# Patient Record
Sex: Male | Born: 1935 | Race: White | Hispanic: No | Marital: Married | Smoking: Never smoker
Health system: Southern US, Community
[De-identification: ages and names within clinical notes are randomized; demographics above are authoritative.]

## PROBLEM LIST (undated history)

## (undated) DIAGNOSIS — E785 Hyperlipidemia, unspecified: Secondary | ICD-10-CM

## (undated) DIAGNOSIS — I10 Essential (primary) hypertension: Secondary | ICD-10-CM

## (undated) HISTORY — PX: JOINT REPLACEMENT: SHX530

## (undated) HISTORY — DX: Essential (primary) hypertension: I10

## (undated) HISTORY — DX: Hyperlipidemia, unspecified: E78.5

---

## 2005-02-13 ENCOUNTER — Inpatient Hospital Stay (HOSPITAL_COMMUNITY): Admission: EM | Admit: 2005-02-13 | Discharge: 2005-02-15 | Payer: Self-pay | Admitting: Emergency Medicine

## 2005-09-15 ENCOUNTER — Encounter: Admission: RE | Admit: 2005-09-15 | Discharge: 2005-09-15 | Payer: Self-pay | Admitting: Vascular Surgery

## 2007-02-06 ENCOUNTER — Encounter: Admission: RE | Admit: 2007-02-06 | Discharge: 2007-02-06 | Payer: Self-pay | Admitting: Orthopedic Surgery

## 2009-03-31 ENCOUNTER — Ambulatory Visit: Payer: Self-pay | Admitting: Gastroenterology

## 2009-04-04 ENCOUNTER — Encounter: Admission: RE | Admit: 2009-04-04 | Discharge: 2009-04-04 | Payer: Self-pay | Admitting: Orthopedic Surgery

## 2010-05-03 ENCOUNTER — Telehealth: Payer: Self-pay | Admitting: Gastroenterology

## 2010-06-09 ENCOUNTER — Encounter (INDEPENDENT_AMBULATORY_CARE_PROVIDER_SITE_OTHER): Payer: Self-pay | Admitting: *Deleted

## 2010-06-11 ENCOUNTER — Ambulatory Visit: Payer: Self-pay | Admitting: Internal Medicine

## 2010-06-30 ENCOUNTER — Ambulatory Visit: Payer: Self-pay | Admitting: Internal Medicine

## 2010-07-01 ENCOUNTER — Encounter: Payer: Self-pay | Admitting: Internal Medicine

## 2011-01-13 NOTE — Miscellaneous (Signed)
Summary: [facility] PV  Clinical Lists Changes  Medications: Added new medication of DULCOLAX 5 MG  TBEC (BISACODYL) Day before procedure take 2 at 3pm and 2 at 8pm. - Signed Added new medication of REGLAN 10 MG  TABS (METOCLOPRAMIDE HCL) As per prep instructions. - Signed Added new medication of MIRALAX   POWD (POLYETHYLENE GLYCOL 3350) As per prep  instructions. - Signed Rx of DULCOLAX 5 MG  TBEC (BISACODYL) Day before procedure take 2 at 3pm and 2 at 8pm.;  #4 x 0;  Signed;  Entered by: Ezra Sites RN;  Authorized by: Hart Carwin MD;  Method used: Electronically to [facility]  [phone]*, [address] [place], [place], [place]  [address], Ph: [phone] or [phone], Fax: [phone] Rx of REGLAN 10 MG  TABS (METOCLOPRAMIDE HCL) As per prep instructions.;  #2 x 0;  Signed;  Entered by: Ezra Sites RN;  Authorized by: Hart Carwin MD;  Method used: Electronically to [facility]  [phone]*, [address], [place], [place], [place]  [address], Ph: [phone] or [phone], Fax: [phone] Rx of MIRALAX   POWD (POLYETHYLENE GLYCOL 3350) As per prep  instructions.;  #255gm x 0;  Signed;  Entered by: Ezra Sites RN;  Authorized by: Hart Carwin MD;  Method used: Electronically to [facility]  [phone]*, [address] [place], [place], [place]  [address], Ph: [phone] or [phone], Fax: [phone] Observations: Added new observation of ALLERGY REV: Done (06/11/2010 10:49)    Prescriptions: MIRALAX   POWD (POLYETHYLENE GLYCOL 3350) As per prep  instructions.  #255gm x 0   Entered by:   Ezra Sites RN   Authorized by:   Hart Carwin MD   Signed by:   Ezra Sites RN on 06/11/2010   Method used:   Electronically to        [facility]  [phone]* (retail)       [address]       [place], [place]  [address]       Ph: [phone] or [phone]       Fax: [phone]   RxID:    [id number] REGLAN 10 MG  TABS (METOCLOPRAMIDE HCL) As per prep instructions.  #2 x 0   Entered by:   Ezra Sites RN   Authorized by:   Hart Carwin MD   Signed by:   Ezra Sites RN on 06/11/2010   Method used:   Electronically to        [facility]  [phone]* (retail)       [address]       [place], [place]  [address]       Ph: [phone] or [phone]       Fax: [phone]   RxID:   [id number] DULCOLAX 5 MG  TBEC (BISACODYL) Day before procedure take 2 at 3pm and 2 at 8pm.  #4 x 0   Entered by:   Ezra Sites RN   Authorized by:   Hart Carwin MD   Signed by:   Ezra Sites RN on 06/11/2010   Method used:   Electronically to        [facility]  [phone]* (retail)       [address]       [place], [place]  [address]  Ph: [phone] or [phone]       Fax: [phone]   RxID:   [id number]

## 2011-01-13 NOTE — Progress Notes (Signed)
Summary: Provider Switch  Phone Note From Other Clinic   Caller: Asher Muir @ [facility]  [phone] Call For: Dr. Arlyce Dice Summary of Call: Pt. is a pt. of Dr. Arlyce Dice and would like to switch to Dr. Juanda Chance b/c she was recommended from friends and family. Initial call taken by: Karna Christmas,  May 03, 2010 9:42 AM  Follow-up for Phone Call        [place] ntfd. of process for transfer to DrBrodie and that request will be forwarded to Dr.Kaplan Follow-up by: Teryl Lucy RN,  May 03, 2010 10:30 AM  Additional Follow-up for Phone Call Additional follow up Details #1::        ok Additional Follow-up by: Louis Meckel MD,  May 04, 2010 12:00 PM     Appended Document: Provider Switch OK DB  Appended Document: Provider Switch Message left for Asher Muir at [facility], MD switch request has been approved, please call [phone] to schedule an appt. with Dr.Brodie.

## 2011-01-13 NOTE — Letter (Signed)
Summary: Patient Notice- Polyp Results  [facility]  [address] [place], [place] [address]   Phone: [phone]  Fax: [phone]        July 01, 2010 MRN: [id number]    Jimmy Orr [address] [place], [place]  [address]    Dear Mr. Ahrendt,  I am pleased to inform you that the colon polyp(s) removed during your recent colonoscopy was (were) found to be benign (no cancer detected) upon pathologic examination.thee out of five polyps were adenomatous ( precancerous).  I recommend you have a repeat colonoscopy examination in 3_ years to look for recurrent polyps, as having colon polyps increases your risk for having recurrent polyps or even colon cancer in the future.  Should you develop new or worsening symptoms of abdominal pain, bowel habit changes or bleeding from the rectum or bowels, please schedule an evaluation with either your primary care physician or with me.  Additional information/recommendations:  _x_ No further action with gastroenterology is needed at this time. Please      follow-up with your primary care physician for your other healthcare      needs.  __ Please call [phone] to schedule a return visit to review your      situation.  __ Please keep your follow-up visit as already scheduled.  __ Continue treatment plan as outlined the day of your exam.  Please call us if you are having persistent problems or have questions about your condition that have not been fully answered at this time.  Sincerely,  Hart Carwin MD  This letter has been electronically signed by your physician.  Appended Document: Patient Notice- Polyp Results letter mailed

## 2011-01-13 NOTE — Letter (Signed)
Summary: Miralax Instructions  [facility]  [address]   [place], [place] [address]   Phone: [phone]  Fax: [phone]       Jimmy Orr    August 28, 1931    MRN: [id number]       Procedure Day Dorna Bloom: Wednesday, 06-30-10     Arrival Time: 9:30 a.m.     Procedure Time: 10:30 a.m.     Location of Procedure:                    x   [facility] (4th Floor)    PREPARATION FOR COLONOSCOPY WITH MIRALAX  Starting 5 days prior to your procedure  06-25-10 do not eat nuts, seeds, popcorn, corn, beans, peas,  salads, or any raw vegetables.  Do not take any fiber supplements (e.g. Metamucil, Citrucel, and Benefiber). ____________________________________________________________________________________________________   THE DAY BEFORE YOUR PROCEDURE         DATE: 06-29-10  DAY: Tuesday  1   Drink clear liquids the entire day-NO SOLID FOOD  2   Do not drink anything colored red or purple.  Avoid juices with pulp.  No orange juice.  3   Drink at least 64 oz. (8 glasses) of fluid/clear liquids during the day to prevent dehydration and help the prep work efficiently.  CLEAR LIQUIDS INCLUDE: Water Jello Ice Popsicles Tea (sugar ok, no milk/cream) Powdered fruit flavored drinks Coffee (sugar ok, no milk/cream) Gatorade Juice: apple, white grape, white cranberry  Lemonade Clear bullion, consomm, broth Carbonated beverages (any kind) Strained chicken noodle soup Hard Candy  4   Mix the entire bottle of Miralax with 64 oz. of Gatorade/Powerade in the morning and put in the refrigerator to chill.  5   At 3:00 pm take 2 Dulcolax/Bisacodyl tablets.  6   At 4:30 pm take one Reglan/Metoclopramide tablet.  7  Starting at 5:00 pm drink one 8 oz glass of the Miralax mixture every 15-20 minutes until you have finished drinking the entire 64 oz.  You should finish drinking prep around 7:30 or 8:00 pm.  8   If you are nauseated, you may take the 2nd  Reglan/Metoclopramide tablet at 6:30 pm.        9    At 8:00 pm take 2 more DULCOLAX/Bisacodyl tablets.     THE DAY OF YOUR PROCEDURE      DATE:   06-30-10  DAY: Wednesday  You may drink clear liquids until 8:30 a.m.  (2 HOURS BEFORE PROCEDURE).   MEDICATION INSTRUCTIONS  Unless otherwise instructed, you should take regular prescription medications with a small sip of water as early as possible the morning of your procedure.         OTHER INSTRUCTIONS  You will need a responsible adult at least 75 years of age to accompany you and drive you home.   This person must remain in the waiting room during your procedure.  Wear loose fitting clothing that is easily removed.  Leave jewelry and other valuables at home.  However, you may wish to bring a book to read or an iPod/MP3 player to listen to music as you wait for your procedure to start.  Remove all body piercing jewelry and leave at home.  Total time from sign-in until discharge is approximately 2-3 hours.  You should go home directly after your procedure and rest.  You can resume normal activities the day after your procedure.  The day of your procedure you should not:  Drive   Make legal decisions   Operate machinery   Drink alcohol   Return to work  You will receive specific instructions about eating, activities and medications before you leave.   The above instructions have been reviewed and explained to me by   Ezra Sites RN  June 11, 2010 11:13 AM     I fully understand and can verbalize these instructions _____________________________ Date _______

## 2011-01-13 NOTE — Procedures (Signed)
Summary: Colonoscopy  Patient: Jimmy Orr Note: All result statuses are Final unless otherwise noted.  Tests: (1) Colonoscopy (COL)   COL Colonoscopy           DONE     [facility]     [address]     [place], [place]  [address]           COLONOSCOPY PROCEDURE REPORT           PATIENT:  Jimmy, Orr  MR#:  [id number]     BIRTHDATE:  01/05/1931, 78 yrs. old  GENDER:  male     ENDOSCOPIST:  Hedwig Morton. Juanda Chance, MD     REF. BY:  Benedetto Goad, MD     PROCEDURE DATE:  06/30/2010     PROCEDURE:  Colonoscopy 213[address]     ASA CLASS:  Class I     INDICATIONS:  Routine Risk Screening flexible sigmoidoscopy in     1989 and in 1999     MEDICATIONS:   Versed 8 mg, Fentanyl 75 mcg           DESCRIPTION OF PROCEDURE:   After the risks benefits and     alternatives of the procedure were thoroughly explained, informed     consent was obtained.  Digital rectal exam was performed and     revealed no rectal masses.   The LB [id number]-[id number] endoscope     was introduced through the anus and advanced to the cecum, which     was identified by both the appendix and ileocecal valve, without     limitations.  The quality of the prep was good, using MiraLax.     The instrument was then slowly withdrawn as the colon was fully     examined.     <<PROCEDUREIMAGES>>           FINDINGS:  There were multiple polyps identified and removed.     throughout the colon. The polyp was removed using cold biopsy     forceps. Polyps were snared without cautery. Retrieval was     successful (see image2, image3, image4, image9, image10, and     image11). snare polyp 5 polyps, 3 in the right colon and 2 in left     colon, size 4-8 mm, all sessile  This was otherwise a normal     examination of the colon (see image12, image7, image6, and     image5).   Retroflexed views in the rectum revealed no     abnormalities.    The scope was then withdrawn from the patient     and the procedure completed.        COMPLICATIONS:  None     ENDOSCOPIC IMPRESSION:     1) Polyps, multiple throughout the colon     2) Otherwise normal examination     RECOMMENDATIONS:     1) Await pathology results     2) High fiber diet.     REPEAT EXAM:  In 3 - 5 year(s) for.           ______________________________     Hedwig Morton. Juanda Chance, MD           CC:           n.     eSIGNED:   Hedwig Morton. Koron Godeaux at 06/30/2010 12:18 PM           Doran Durand, [id number]  Note: An exclamation mark (!) indicates a result that was  not dispersed into the flowsheet. Document Creation Date: 06/30/2010 12:20 PM _______________________________________________________________________  (1) Order result status: Final Collection or observation date-time: 06/30/2010 12:10 Requested date-time:  Receipt date-time:  Reported date-time:  Referring Physician:   Ordering Physician: Lina Sar [phone]) Specimen Source:  Source: Launa Grill Order Number: [id number] Lab site:   Appended Document: Colonoscopy     Procedures Next Due Date:    Colonoscopy: 10/2010

## 2011-04-29 NOTE — Consult Note (Signed)
NAMEWYNSTON, Orr NO.:  [id number]   MEDICAL RECORD NO.:  [id number]          PATIENT TYPE:  INP   LOCATION:  3728                         FACILITY:  [facility]   PHYSICIAN:  Larina Earthly, M.D.    DATE OF BIRTH:  10/30/1931   DATE OF CONSULTATION:  02/15/2005  DATE OF DISCHARGE:                                   CONSULTATION   HISTORY OF PRESENT ILLNESS:  Jimmy Orr is a 77 year old gentleman who was  admitted on February 13, 2005 with severe substernal chest pain. He reports that  he also had left flank pain. He had had  prior similar substernal pain that  has been worked up before and diagnosed as reflux. He had never had any  prior left flank pain and that resolved. He has had a negative cardiac  workup. At the time of admission, he had a CT scan to rule out thoracic  dissection. This did show no evidence of thoracic dissection. There was a  question of a celiac access irregularity and therefore, he underwent a  dedicated CT angio of his abdominal aorta. I am seeing him for further  evaluation of this.   PAST MEDICAL HISTORY:  Significant for hypertension. Does have a history of  gallstones and hyperlipidemia. He has no history of cardiac disease.   SOCIAL HISTORY:  He does not smoke or drink alcohol.   FAMILY HISTORY:  He has a family history of father dying during coronary  artery bypass grafting. Mother has Alzheimer's.   PHYSICAL EXAMINATION:  GENERAL:  A well developed, well nourished white male  appearing stated age of 77. In no acute distress.  VITAL SIGNS:  Blood pressure is 101/71, pulse 90, respiratory rate 16, he is  afebrile.  EXTREMITIES:  His radial, femoral and dorsalis pedis pulses are 2+  bilaterally.  ABDOMEN:  Examination shows no evidence of aneurysm. He has no abdominal  tenderness to palpation.   LABORATORY DATA:  His CT angio does show a linear irregularity in the celiac  access. There is no flow limitation through the branches of  the celiac  artery. The superior mesenteric artery and inferior mesenteric artery are  widely patent without any evidence of flow limiting difficulties.   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 I discussed this at length with Mr. and  Jimmy Orr. Will see him in 6 months for repeat CT angio to rule out any  change.      TFE/MEDQ  D:  02/15/2005  T:  02/15/2005  Job:  [id number]

## 2011-04-29 NOTE — Discharge Summary (Signed)
Jimmy Orr, GIPE NO.:  [id number]   MEDICAL RECORD NO.:  [id number]          PATIENT TYPE:  INP   LOCATION:  3728                         FACILITY:  [facility]   PHYSICIAN:  Elliot Cousin, M.D.    DATE OF BIRTH:  1931/05/18   DATE OF ADMISSION:  02/13/2005  DATE OF DISCHARGE:  02/15/2005                                 DISCHARGE SUMMARY   DISCHARGE DIAGNOSES:  1.  Chest pain, myocardial infarction ruled out.  2.  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  3.  Hyperbilirubinemia thought to be secondary to gallstones.  4.  Cholelithiasis without evidence of cholecystitis per ultrasound and CT      scan of the abdomen, March5,2006   SECONDARY DISCHARGE DIAGNOSES:  1.  Hypertension.  2.  Hyperlipidemia (the patient refuses to take Zetia as prescribed).  3.  Hiatal hernia.  4.  Gastroesophageal reflux disease.   DISCHARGE MEDICATIONS:  1.  Lopressor 50 mg daily.  2.  Protonix 40 mg daily.  3.  Mylanta one to two tablespoons every 6 hours as needed.  4.  Percocet 5 mg every 6 hours as needed for pain.  5.  Zetia 10 mg daily (the patient was advised to discuss this medication      with his primary care physician).   DISCHARGE DISPOSITION:  The patient was discharged to home on March7,2006 in  improved and stable condition.  The patient was advised to follow up with  Dr. Andrey Campanile in 1 week.  Dr. Arbie Cookey will schedule a followup appointment with  the patient.   HISTORY OF PRESENT ILLNESS:  The patient is a 77 year old man with a past  medical history significant for hypertension, gastroesophageal reflux  disease and gallstones, who presented to the emergency department on  March5,2006 with a chief complaint of retrosternal chest pain.  He rated the  pain as a 10/10 in intensity.  There was no associated diaphoresis,  shortness of breath, palpitations, nausea or vomiting.  When the patient was  evaluated in the emergency department, a CT scan of the chest revealed no  pulmonary embolism and no evidence of aortic dissection.  The initial  cardiac markers were negative.  However, the patient was admitted for  further evaluation and management.   HOSPITAL COURSE:  PROBLEM #1 - CHEST PAIN:  The patient's EKG on admission  revealed normal sinus rhythm with a heart rate of 91 beats per minute and  question of Q-waves in the inferior leads.  There were no significant ST or  T-wave abnormalities.  The patient was started on Lopressor 50 mg daily and  prophylactic Lovenox.  Nitroglycerin sublingual was prescribed as needed.  In addition, the patient was started on Nitrol paste half an inch every 4  hours.  Cardiac enzymes were ordered.  The cardiac enzymes were completely  negative x2 sets.  As mentioned above his cardiac markers x3 were negative  while he was evaluated in the emergency department.  The patient's fasting  lipid panel was assessed  during hospital course and revealed a total  cholesterol of 177, triglycerides of 85, HDL cholesterol of 46, and an LDL  cholesterol of 114.  The patient had been prescribed Zetia by his primary  care physician; however, the patient stopped secondary to nonspecific side-  effects.  The patient was started on Protonix and Mylanta for possible  reflux symptoms.  The Protonix and Mylanta did seem to lessen his chest  pain.  The patient was chest-pain-free at the time of hospital discharge.   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  The CT scan was negative for PE.  However, upon  further evaluation by the radiologist, there was an abnormality seen near  the origin of the celiac artery.  Therefore, a CT angiogram of the abdomen  was ordered for further clarification.  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  The SMA and IMA were widely patent on the CT angiogram of the abdomen.  Following this finding, vascular surgeon, Dr. Arbie Cookey, was consulted.  Per Dr.  Bosie Helper assessment, the patient either had a dissection or an eccentric  plaque in the celiac access.  He felt that this was probably chronic.  He  discussed the results of the CT angiogram at length with the patient and the  patient's wife.  He felt that the patient had no immediate risk regarding  this finding.  He recommended repeating a CT angiogram in 6 months to rule  out any change.  Dr. Arbie Cookey stated that he will follow up with the patient  then.   PROBLEM #3 - CHRONIC CHOLELITHIASIS:  Gallstones were seen on the CT scan of  the chest and abdomen.  There was no evidence of cholecystitis.  However,  for further clarification, an ultrasound of the abdomen was ordered.  The  ultrasound revealed several mobile gallstones and no evidence of biliary  obstruction.  The patient had only mild right upper quadrant tenderness.  His bilirubin was mildly elevated, ranging between 1.5 and 1.6, during  hospitalization.  However, his liver transaminases were well within normal  limits.  It was felt that the patient was non-acute with regards to the  gallstones and therefore the patient will be monitored for now.      DF/MEDQ  D:  04/29/2005  T:  04/29/2005  Job:  [id number]   cc:   Gloriajean Dell. Andrey Campanile, M.D.  [address]  [place]  [place] [address]  Fax: [phone]   Larina Earthly, M.D.  [address]  [place]  [place] [address]

## 2011-04-29 NOTE — H&P (Signed)
NAME:  Jimmy Orr, Jimmy Orr                ACCOUNT NO.:  [id number]   MEDICAL RECORD NO.:  [id number]          PATIENT TYPE:  EMS   LOCATION:  [facility]                         FACILITY:  [facility]   PHYSICIAN:  Mobolaji B. Bakare, M.D.DATE OF BIRTH:  [birth year]   DATE OF ADMISSION:  02/13/2005  DATE OF DISCHARGE:                                HISTORY & PHYSICAL   PRIMARY CARE PHYSICIAN:  Dr. Benedetto Goad.   CHIEF COMPLAINT:  Retrosternal chest pain.   HISTORY OF PRESENTING COMPLAINT:  [name] is a pleasant 77 year old  Caucasian male with a history of hypertension, gastroesophageal reflux  disease and gallstones.  He started having retrosternal chest pain about 5  a.m. today.  He was already awake at that time and the pain was radiating to  his left shoulder blade.  It was 10/10.  No accompanying diaphoresis,  shortness of breath, or palpitations,  nausea or vomiting.  It is pleuritic  in nature, worsens with deep inspiration.  He feels very uncomfortable lying  down supine hence, he has been sitting up in bed.  There is no accompanying  fever or chills or rigors.   He has had similar pain in the past.  Most recently one was five years ago  and at that time, he had a stress test by [facility] which was  negative.  He also underwent an upper GI series which was negative and a  colonoscopy.   This pain is not related to meals.  He had negative cardiac enzymes at the  point of care.  His EKG was normal sinus rhythm, no evidence of  pericarditis.  He had a CT scan of the chest, CT scan angiogram was negative  for pulmonary embolism.  No aortic dissection.  There were gallstones but  without evidence of cholecystitis.  No pericholecystic fluid.  It did show  periaortic thickening and subsegmental atelectasis in the mid and lower  lungs bilaterally.   He was given morphine in the emergency room and this improved the pain  somewhat down to 4/10 however, he had some nausea and vomiting  with this.   REVIEW OF SYSTEMS:  He denies cough, dyspnea on exertion, shortness of  breath, no constipation, no diarrhea.  No dysuria.   PAST MEDICAL HISTORY:  1.  Hypertension.  2.  Hyperlipidemia.  3.  Hiatus hernia.  4.  Gallstones.   MEDICATIONS:  1.  Zetia 10 mg p.o. daily.  He stopped this a while ago secondary to side      effects.  2.  Lopressor 50 mg p.o. daily.  3.  Vitamins.   ALLERGIES:  Side effects to statins with cramps and muscle aches.   FAMILY HISTORY:  Father died during bypass surgery and mom is still alive  with Alzheimer's and she has a history of angina.  The patient is married  and lives with his wife.  He has got a supportive son who lives in the area.   SOCIAL HISTORY:  He never smoked cigarettes and does not drink alcohol.  He  is a retired Personnel officer.  PHYSICAL EXAMINATION:  VITAL SIGNS:  On arrival in the emergency department,  temperature 98.1, blood pressure 172/97, currently blood pressure is 189/86,  repeated heart rate 109, respiratory rate 22 and O2 saturations of 95% on 2  liters.  GENERAL:  On examination, the patient is fairly obese, not in respiratory  distress.  Sitting up in bed. He looks somewhat apprehensive.  HEENT:  Not pale.  Anicteric.  Normocephalic, atraumatic head. Pupils equal,  round, reactive to light.  Extraocular muscles movements intact.  NECK:  No thyromegaly.  Mucous membranes moist.  No oropharyngeal exudate.  LUNGS:  Clear clinically to auscultation.  CARDIOVASCULAR:  S1, S2.  Regular, no murmur.  No gallop.  CHEST WALL:  No tenderness.  ABDOMEN:  Obese, soft.  Mild tenderness on deep palpation over the  epigastrium.  No hepatosplenomegaly.  No holosystolic murmur.  Bowel sounds  present.  EXTREMITIES:  No calf tenderness.  No pedal edema.  No cyanosis.  Dorsalis  pedis pulses palpable bilaterally at 2+.  SKIN:  No rash, no petechia.  CNS:  Muscle power 5/5 in all limbs.  Cranial nerves intact.   INITIAL  LABORATORY DATA:  Cardiac markers at the point of care were  essentially within normal x3.  Sodium 139, potassium 4.1, chloride 108, BUN  18, glucose 105, bicarb 26, hemoglobin 15.6, hematocrit 46, total protein  6.7, albumin 3.6, AST 25, ALT 22, alkaline phosphatase 67, bilirubin 1.5,  total bilirubin 1.5, indirect bilirubin 1.3, lactic acid 1.4, lipase 16,  amylase 65, creatinine 1.2, BUN 18.   EKG:  Normal sinus rhythm.  Chest x-ray showed cardiac enlargement, low  volume chest film with vascular crowding and bibasilar atelectasis.  No  edema or infiltrate.   ASSESSMENT AND PLAN:  [name] is a 77 year old Caucasian male with  a history of hypertension, hiatus hernia and gallstones presenting with  retrosternal chest pain.  He has had negative cardiac enzymes at the point  of care.  EKG is normal sinus rhythm.  CT angiogram is negative for  pulmonary embolism, dissection, and no pericardial effusion.   PROBLEMS:  1.  Chest pain, probable gastrointestinal related however, will admit to      telemetry, rule out myocardial infarction with serial cardiac enzymes.      Obtain ultrasound of the gallbladder in the morning, obtain 2D      echocardiogram and in view of the cardiomegaly.   Protonix 40 mg p.o. daily.  Nitro paste 0.5 inch topical q.4h., baby aspirin  81 mg daily, hepatitic profile in the a.m., Mylanta p.o. 15 cc p.r.n.,  Dilaudid 0.5 mg IV q.3h. p.r.n. for pain and Phenergan 12.5 mg IV q.4h.  p.r.n. for nausea and vomiting.   1.  Hypertension.  Uncontrolled.  This is probably complicated by the      current pain.  Will give IV Lopressor 5 mg and continue on Lopressor 50      mg p.o. daily.  2.  Sinus tachycardia secondary to pain.  Will control pain with Dilaudid as      above.  3.  Hyperlipidemia.  Check fasting lipid profile.  4.  Deep venous thrombosis prophylaxis.  Will use Lovenox 40 mg subcutaneous      daily.     MBB/MEDQ  D:  02/13/2005  T:  02/13/2005   Job:  [id number]   cc:   Gloriajean Dell. Andrey Campanile, M.D.  [address]  [place]  [place] [address]  Fax: [phone]

## 2012-04-13 ENCOUNTER — Encounter: Payer: Medicare Other | Attending: Family Medicine | Admitting: *Deleted

## 2012-04-13 DIAGNOSIS — Z713 Dietary counseling and surveillance: Secondary | ICD-10-CM | POA: Insufficient documentation

## 2012-04-13 DIAGNOSIS — E119 Type 2 diabetes mellitus without complications: Secondary | ICD-10-CM | POA: Insufficient documentation

## 2012-04-13 NOTE — Progress Notes (Signed)
  Medical Nutrition Therapy:  Appt start time: 1030 end time:  1130.  Assessment:  Primary concerns today: newly diagnosed with diabetes. Here for training on new BG Meter prior to attending Core Diabetes Classes in 2 weeks  MEDICATIONS: see list   DIETARY INTAKE:  Usual eating pattern includes 3 meals and 0 snacks per day.  Everyday foods include good variety of most food groups.  Avoided foods include carb containing foods until he can be taught carb counting.    24-hr recall:  B ( 9 AM): cereal - raisin bran, fresh fruit and 2 pcs toast with small amount of jam, OR eggs, bacon = lean, 2 toast or biscuits, decaf coffee with 1 tsp low fat creamer  Snk ( AM): none  L ( PM): light meal - sandwich and milk or water Snk ( PM): none D ( PM): eat out often at [employer]- lean meat, salad and water, occasionally pie Snk ( PM): none Beverages: decaf coffee, water, milk  Usual physical activity: walks on trail by house 2-3 times a week, 1-2 miles each time with hills  Estimated energy needs: to be determined in class  Progress Towards Goal(s):  In progress.   Nutritional Diagnosis:  NB-1.4 Self-monitoring deficit As related to use of SMBG meter.  As evidenced by being taught today.    Intervention:  Provided  One Touch Ultra Mini Self Monitoring Kit and instructed on it's use. Suggested he test once a day alternating the times of day until he can attend the Core Diabetes Classes. Due to his new diagnosis, he had several questions that I addressed in addition to the meter instruction.  Handouts given during visit include:  One Touch Ultra Mini Self Monitoring Kit with instructions  Lot # [id number] x  Expiration Date: 04/2012  Monitoring/Evaluation:  Dietary intake, exercise, SMBG, and body weight in 2 week(s) for Core Classes.

## 2012-04-14 ENCOUNTER — Encounter: Payer: Self-pay | Admitting: *Deleted

## 2012-04-14 NOTE — Patient Instructions (Signed)
Plan: Start checking your BG with meter you received today once a day at alternate times of day as directed by MD Plan to attend Core Diabetes Classes in 2 weeks as scheduled

## 2012-04-25 ENCOUNTER — Encounter: Payer: Medicare Other | Admitting: *Deleted

## 2012-04-26 ENCOUNTER — Encounter: Payer: Self-pay | Admitting: *Deleted

## 2012-04-26 NOTE — Patient Instructions (Signed)
Patient will attend Core Diabetes Courses II and III as scheduled or follow up prn.  

## 2012-04-26 NOTE — Progress Notes (Signed)
  Patient was seen on 04/26/12 for the first of a series of three diabetes self-management courses at the [facility]. The following learning objectives were met by the patient during this course:   Defines the role of glucose and insulin  Identifies type of diabetes and pathophysiology  Defines the diagnostic criteria for diabetes and prediabetes  States the risk factors for Type 2 Diabetes  States the symptoms of Type 2 Diabetes  Defines Type 2 Diabetes treatment goals  Defines Type 2 Diabetes treatment options  States the rationale for glucose monitoring  Identifies A1C, glucose targets, and testing times  Identifies proper sharps disposal  Defines the purpose of a diabetes food plan  Identifies carbohydrate food groups  Defines effects of carbohydrate foods on glucose levels  Identifies carbohydrate choices/grams/food labels  States benefits of physical activity and effect on glucose  Review of suggested activity guidelines  Last A1c (per MD referral on 04/10/12): 7.6%   Handouts given during class include:  Type 2 Diabetes: Basics Book  My Food Plan Book  Food and Activity Log  Patient has established the following initial goals:  Increase exercise levels.  Follow a diabetes meal plan.  Monitor blood glucose.  Keep doctor's appointments.  Get medical labs/tests done.  Follow-Up Plan: Patient will attend Core Diabetes Courses II and III as scheduled or follow up prn.

## 2012-05-24 ENCOUNTER — Encounter: Payer: Self-pay | Admitting: *Deleted

## 2012-05-24 ENCOUNTER — Encounter: Payer: Medicare Other | Attending: Family Medicine | Admitting: *Deleted

## 2012-05-24 DIAGNOSIS — Z713 Dietary counseling and surveillance: Secondary | ICD-10-CM | POA: Insufficient documentation

## 2012-05-24 DIAGNOSIS — E119 Type 2 diabetes mellitus without complications: Secondary | ICD-10-CM | POA: Insufficient documentation

## 2012-05-24 NOTE — Patient Instructions (Signed)
Goals:  Follow Diabetes Meal Plan as instructed  Eat 3 meals and 2 snacks, every 3-5 hrs  Limit carbohydrate intake to 45-60 grams carbohydrate/meal  Limit carbohydrate intake to 15 grams carbohydrate/snack  Add lean protein foods to meals/snacks  Monitor glucose levels as instructed by your doctor  Aim for 30 mins of physical activity daily  Bring food record and glucose log to your next nutrition visit 

## 2012-05-24 NOTE — Progress Notes (Signed)
  Patient was seen on 05/24/12 for the second of a series of three diabetes self-management courses at the [facility]. The following learning objectives were met by the patient during this course:   Explain basic nutrition maintenance and quality assurance  Describe causes, symptoms and treatment of hypoglycemia and hyperglycemia  Explain how to manage diabetes during illness  Describe the importance of good nutrition for health and healthy eating strategies  List strategies to follow meal plan when dining out  Describe the effects of alcohol on glucose and how to use it safely  Describe problem solving skills for day-to-day glucose challenges  Describe strategies to use when treatment plan needs to change  Identify important factors involved in successful weight loss  Describe ways to remain physically active  Describe the impact of regular activity on insulin resistance    Handouts given in class:    [facility] Oral medication/insulin handout  Follow-Up Plan: Patient will attend the final class of the ADA Diabetes Self-Care Education.   

## 2012-05-31 ENCOUNTER — Encounter: Payer: Medicare Other | Admitting: *Deleted

## 2012-05-31 ENCOUNTER — Encounter: Payer: Self-pay | Admitting: *Deleted

## 2012-05-31 NOTE — Patient Instructions (Signed)
Goals:  Follow Diabetes Meal Plan as instructed  Eat 3 meals and 2 snacks, every 3-5 hrs  Limit carbohydrate intake to 45-60 grams carbohydrate/meal  Limit carbohydrate intake to 15-30 grams carbohydrate/snack  Add lean protein foods to meals/snacks  Monitor glucose levels as instructed by your doctor  Aim for 30 mins of physical activity daily  Bring food record and glucose log to your next nutrition visit 

## 2012-05-31 NOTE — Progress Notes (Signed)
  Patient was seen on 05/31/12 for the third of a series of three diabetes self-management courses at the [facility]. The following learning objectives were met by the patient during this course:    Describe how diabetes changes over time   Identify diabetes complications and ways to prevent them   Describe strategies that can promote heart health including lowering blood pressure and cholesterol   Describe strategies to lower dietary fat and sodium in the diet   Identify physical activities that benefit cardiovascular health   Evaluate success in meeting personal goal   Describe the belief that they can live successfully with diabetes day to day   Establish 2-3 goals that they will plan to diligently work on until they return for the free 33-month follow-up visit  The following handouts were given in class:  3 Month Follow Up Visit handout  Goal setting handout  Class evaluation form  Your patient has established the following 3 month goals for diabetes self-care:  Count carbohydrates at most meals and snacks  Test glucose 1 times every day  Be active by walking trail behind home  Follow-Up Plan: Patient will attend a 3 month follow-up visit for diabetes self-management education.

## 2012-08-30 ENCOUNTER — Encounter: Payer: Medicare Other | Attending: Family Medicine | Admitting: *Deleted

## 2012-08-30 DIAGNOSIS — E119 Type 2 diabetes mellitus without complications: Secondary | ICD-10-CM | POA: Insufficient documentation

## 2012-08-30 DIAGNOSIS — Z713 Dietary counseling and surveillance: Secondary | ICD-10-CM | POA: Insufficient documentation

## 2012-08-30 NOTE — Progress Notes (Signed)
  Patient was seen on 08/30/12 for their 3 month follow-up as a part of the diabetes self-management courses at the [facility]. The following learning objectives were met by your patient during this course:  Patient self reports the following:  Diabetes control has improved since diabetes self-management training: yes! New HgA1C is 6.4% down from 7.6%!  Annette Stable has lost about 27 pounds since June, his kidney function has improved, and his lipid profile has improved Number of days blood glucose is >200: none Last MD appointment for diabetes: 07/18/12 Changes in treatment plan: no DM medications, well controlled Confidence with ability to manage diabetes: feels confident Areas for improvement with diabetes self-care: Needs low carb snack ideas- gave handout on low carb snacks Willingness to participate in diabetes support group: will try   Follow-Up Plan: Patient is eligible for a "free" 30 minute diabetes self-care appointment in the next year. Patient to call and schedule as needed.  Patient was seen on 05/31/12 for the third of a series of three diabetes self-management courses at the [facility]. The following learning objectives were met by the patient during this course:    Describe how diabetes changes over time   Identify diabetes complications and ways to prevent them   Describe strategies that can promote heart health including lowering blood pressure and cholesterol   Describe strategies to lower dietary fat and sodium in the diet   Identify physical activities that benefit cardiovascular health   Evaluate success in meeting personal goal   Describe the belief that they can live successfully with diabetes day to day   Establish 2-3 goals that they will plan to diligently work on until they return for the free 80-month follow-up visit  The following handouts were given in class:  3 Month Follow Up Visit handout  Goal setting  handout  Class evaluation form  Your patient has established the following 3 month goals for diabetes self-care:  Count carbohydrates at most meals and snacks  Test glucose 1 times every day  Be active by walking trail behind home  Follow-Up Plan: Patient will attend a 3 month follow-up visit for diabetes self-management education.  Diabetes support group meets every second Monday at 6 pm

## 2012-08-30 NOTE — Patient Instructions (Addendum)
Protein souces: Greek-style yogurt ([place], [place] light and fit greek) aim for less than 10 g sugar Cottage cheese Nuts/seeds Cheese  Pure protein bar

## 2013-05-29 ENCOUNTER — Encounter: Payer: Self-pay | Admitting: Internal Medicine

## 2014-01-27 ENCOUNTER — Emergency Department (HOSPITAL_COMMUNITY)
Admission: EM | Admit: 2014-01-27 | Discharge: 2014-01-27 | Disposition: A | Discharge status: Home / Self Care | Payer: Medicare Other | Attending: Emergency Medicine | Admitting: Emergency Medicine

## 2014-01-27 ENCOUNTER — Encounter (HOSPITAL_COMMUNITY): Payer: Self-pay | Admitting: Emergency Medicine

## 2014-01-27 DIAGNOSIS — I10 Essential (primary) hypertension: Secondary | ICD-10-CM | POA: Insufficient documentation

## 2014-01-27 DIAGNOSIS — Z79899 Other long term (current) drug therapy: Secondary | ICD-10-CM | POA: Insufficient documentation

## 2014-01-27 DIAGNOSIS — E119 Type 2 diabetes mellitus without complications: Secondary | ICD-10-CM | POA: Insufficient documentation

## 2014-01-27 DIAGNOSIS — H9190 Unspecified hearing loss, unspecified ear: Secondary | ICD-10-CM

## 2014-01-27 DIAGNOSIS — Z7982 Long term (current) use of aspirin: Secondary | ICD-10-CM | POA: Insufficient documentation

## 2014-01-27 LAB — GLUCOSE, CAPILLARY: Glucose-Capillary: 106 mg/dL — ABNORMAL HIGH (ref 70–99)

## 2014-01-27 NOTE — ED Provider Notes (Signed)
CSN: [id number]     Arrival date & time 01/27/14  1517 History   First MD Initiated Contact with Patient 01/27/14 1629     Chief Complaint  Patient presents with  . Hearing Problem     (Consider location/radiation/quality/duration/timing/severity/associated sxs/prior Treatment) HPI 80 yo male presents with acute onset of hearing loss that started this morning. Patient states he woke up this morning with noises in bilateral ears (sonar like sounds). Patient states this episode lasted about 1 min and then went away. Patient states he later took a nap at 2pm today and after waking up from nap he states he has had bilateral hearing loss "60-80%" loss of his hearing. Admit to long hx of tinnitus in bilateral ears x "15-20 years" Denies any ear pain, drainage, or trauma. Denies congestion, cough, sore throat, or runny nose. Denies any visual changes or photophobia. Admits to hx of "ocular migraines" over the past 1.5 years. Patient states he sees "white streaks" that that last for "2-3 minutes".  PMH significant for DM that is controlled with lifestyle factors. HTN controlled with Metoprolol and Lisiniopril.  Past Medical History  Diagnosis Date  . Hypertension   . Hyperlipidemia   . Diabetes mellitus    Past Surgical History  Procedure Laterality Date  . Joint replacement     History reviewed. No pertinent family history. History  Substance Use Topics  . Smoking status: Never Smoker   . Smokeless tobacco: Not on file  . Alcohol Use: No    Review of Systems  Constitutional: Negative for fever and chills.      Allergies  Morphine and related; Niacin; and Statins  Home Medications   Current Outpatient Rx  Name  Route  Sig  Dispense  Refill  . aspirin EC 81 MG tablet   Oral   Take 81 mg by mouth daily.         Marland Kitchen. lisinopril (PRINIVIL,ZESTRIL) 10 MG tablet   Oral   Take 10 mg by mouth daily.         . metoprolol succinate (TOPROL-XL) 25 MG 24 hr tablet   Oral   Take  25 mg by mouth at bedtime.         Marland Kitchen. omeprazole (PRILOSEC) 20 MG capsule   Oral   Take 20 mg by mouth daily.          BP 153/3  Pulse 73  Temp(Src) 97.7 F (36.5 C) (Oral)  Resp 18  SpO2 96% Physical Exam  Nursing note and vitals reviewed. Constitutional: He is oriented to person, place, and time. He appears well-developed and well-nourished. No distress.  HENT:  Head: Normocephalic and atraumatic.  Mouth/Throat: Uvula is midline, oropharynx is clear and moist and mucous membranes are normal.  Eyes: Conjunctivae and EOM are normal. Pupils are equal, round, and reactive to light.  Neck: Trachea normal and phonation normal. Neck supple. No JVD present. Carotid bruit is not present. No tracheal deviation present.  Cardiovascular: Normal rate and regular rhythm.  Exam reveals no gallop and no friction rub.   No murmur heard. Pulmonary/Chest: Effort normal and breath sounds normal. No respiratory distress. He has no wheezes. He has no rhonchi. He has no rales.  Musculoskeletal: Normal range of motion. He exhibits no edema.  Neurological: He is alert and oriented to person, place, and time. He has normal strength. No cranial nerve deficit or sensory deficit.  Reflex Scores:      Bicep reflexes are 2+ on the right side  and 2+ on the left side.      Patellar reflexes are 2+ on the right side and 2+ on the left side. CN II-XII grossly intact. Patient ambulates in room without assistance.    Skin: Skin is warm and dry. He is not diaphoretic.  Psychiatric: He has a normal mood and affect. His behavior is normal.    ED Course  Procedures (including critical care time) Labs Review Labs Reviewed  GLUCOSE, CAPILLARY - Abnormal; Notable for the following:    Glucose-Capillary 106 (*)    All other components within normal limits   Imaging Review No results found.  EKG Interpretation    Date/Time:  Monday January 27 2014 15:27:01 EST Ventricular Rate:  79 PR Interval:  194 QRS  Duration: 102 QT Interval:  398 QTC Calculation: 456 R Axis:   36 Text Interpretation:  Normal sinus rhythm Normal ECG Confirmed by GOLDSTON  MD, SCOTT ([id number]) on 01/27/2014 5:20:26 PM            MDM   Final diagnoses:  Hearing loss   EKG appears WNL.  Glucose 106  Neuro exam benign. Prior to discharge patient states the hearing in his LEFT ear has significantly improved.   Plan to have patient call ENT first thing in the morning for follow up appointment. Patient discussed with Dr. Criss Alvine.  Patient agrees with plan.       Rudene Anda, PA-C 01/29/14 [phone]

## 2014-01-27 NOTE — Discharge Instructions (Signed)
Call Dr. Jenne PaneBates first thing in the morning to set up follow up evaluation. Return to the emergency department if you develop any signs of weakness, numbness, or visual changes.   Hearing Loss  A hearing loss is sometimes called deafness. Hearing loss may be partial or total.  CAUSES  Hearing loss may be caused by:  Wax in the ear canal.  Infection of the ear canal.  Infection of the middle ear.  Trauma to the ear or surrounding area.  Fluid in the middle ear.  A hole in the eardrum (perforated eardrum).  Exposure to loud sounds or music.  Problems with the hearing nerve.  Certain medications. Hearing loss without wax, infection, or a history of injury may mean that the nerve is involved. Hearing loss with severe dizziness, nausea and vomiting or ringing in the ear may suggest a hearing nerve irritation or problems in the middle or inner ear. If hearing loss is untreated, there is a greater likelihood for residual or permanent hearing loss.  DIAGNOSIS  A hearing test (audiometry) assesses hearing loss. The audiometry test needs to be performed by a hearing specialist (audiologist).  TREATMENT  Treatment for recent onset of hearing loss may include:  Ear wax removal.  Medications that kill germs (antibiotics).  Cortisone medications.  Prompt follow up with the appropriate specialist. Return of hearing depends on the cause of your hearing loss, so proper medical follow-up is important. Some hearing loss may not be reversible, and a caregiver should discuss care and treatment options with you.  SEEK MEDICAL CARE IF:  You have a severe headache, dizziness, or changes in vision.  You have new or increased weakness.  You develop repeated vomiting or other serious medical problems.  You have a fever. Document Released: 11/28/2005 Document Revised: 02/20/2012 Document Reviewed: 03/25/2010  [facility] Patient Information 2014 [employer], [place].

## 2014-01-27 NOTE — ED Notes (Addendum)
Pt states that he has had increase diffculty with hearing. Pt states it sounds muted. There is a black foreign object in the left ear that patient states is part of his hearing aid. Pt and wife are anxious.

## 2014-01-27 NOTE — ED Notes (Addendum)
Pt sts since yesterday he has been having distorted hearing. sts that things sound funny and currently he sts everything seems muted. Pt does wear hearing aids and sts all this started with hearing aids on. sts he checked his hearing aides and they were working fine. Pt no other complaints. Was sent here by his doctor to receive a shot per wife "to prevent permanent deafness". Pt sts he just doesn't feel right

## 2014-01-29 NOTE — ED Provider Notes (Signed)
Medical screening examination/treatment/procedure(s) were conducted as a shared visit with non-physician practitioner(s) and myself.  I personally evaluated the patient during the encounter.  EKG Interpretation    Date/Time:  Monday January 27 2014 15:27:01 EST Ventricular Rate:  79 PR Interval:  194 QRS Duration: 102 QT Interval:  398 QTC Calculation: 456 R Axis:   36 Text Interpretation:  Normal sinus rhythm Normal ECG Confirmed by Mycal Conde  MD, Albertus Chiarelli ([id number]) on 01/27/2014 5:20:26 PM            I discussed the patient with Dr. Jenne PaneBates, who recommends 24-48 hour f/u in his office for more in depth hearing tests. Patient has otherwise normal exam, and his left ear is improving (but still decreased hearing) while right remains very poor. No other neuro sx, I highly doubt stroke (especially with initial bilateral sx). Per Dr. Jenne PaneBates, for sudden deafness there is really no other ER w/u, will d/c with strict return precautions.  Audree CamelScott T Kelicia Youtz, MD 01/29/14 [phone]

## 2014-03-26 ENCOUNTER — Encounter: Payer: Self-pay | Admitting: Internal Medicine

## 2015-08-19 ENCOUNTER — Other Ambulatory Visit: Payer: Self-pay | Admitting: Dermatology

## 2015-11-12 ENCOUNTER — Encounter: Payer: Self-pay | Admitting: Internal Medicine

## 2016-04-01 ENCOUNTER — Other Ambulatory Visit: Payer: Self-pay | Admitting: Physician Assistant

## 2016-04-01 ENCOUNTER — Ambulatory Visit
Admission: RE | Admit: 2016-04-01 | Discharge: 2016-04-01 | Disposition: A | Discharge status: Home / Self Care | Payer: Medicare Other | Source: Ambulatory Visit | Attending: Physician Assistant | Admitting: Physician Assistant

## 2016-04-01 DIAGNOSIS — S0990XA Unspecified injury of head, initial encounter: Secondary | ICD-10-CM

## 2016-09-28 DIAGNOSIS — E663 Overweight: Secondary | ICD-10-CM | POA: Insufficient documentation

## 2016-09-28 DIAGNOSIS — H9319 Tinnitus, unspecified ear: Secondary | ICD-10-CM | POA: Insufficient documentation

## 2016-09-28 DIAGNOSIS — H903 Sensorineural hearing loss, bilateral: Secondary | ICD-10-CM | POA: Insufficient documentation

## 2016-09-28 DIAGNOSIS — E119 Type 2 diabetes mellitus without complications: Secondary | ICD-10-CM | POA: Insufficient documentation

## 2016-09-28 DIAGNOSIS — M1990 Unspecified osteoarthritis, unspecified site: Secondary | ICD-10-CM | POA: Insufficient documentation

## 2016-09-28 DIAGNOSIS — E785 Hyperlipidemia, unspecified: Secondary | ICD-10-CM | POA: Insufficient documentation

## 2016-09-28 DIAGNOSIS — I10 Essential (primary) hypertension: Secondary | ICD-10-CM | POA: Insufficient documentation

## 2018-04-02 DIAGNOSIS — N289 Disorder of kidney and ureter, unspecified: Secondary | ICD-10-CM | POA: Insufficient documentation

## 2018-04-02 DIAGNOSIS — K5901 Slow transit constipation: Secondary | ICD-10-CM | POA: Insufficient documentation

## 2018-04-02 DIAGNOSIS — Z0000 Encounter for general adult medical examination without abnormal findings: Secondary | ICD-10-CM | POA: Insufficient documentation

## 2018-10-04 DIAGNOSIS — R413 Other amnesia: Secondary | ICD-10-CM | POA: Insufficient documentation

## 2019-05-09 DIAGNOSIS — E538 Deficiency of other specified B group vitamins: Secondary | ICD-10-CM | POA: Insufficient documentation

## 2020-09-15 DIAGNOSIS — L309 Dermatitis, unspecified: Secondary | ICD-10-CM | POA: Insufficient documentation

## 2021-09-03 ENCOUNTER — Ambulatory Visit: Payer: Medicare Other | Admitting: Podiatry

## 2021-09-03 ENCOUNTER — Encounter: Payer: Self-pay | Admitting: Podiatry

## 2021-09-03 ENCOUNTER — Other Ambulatory Visit: Payer: Self-pay

## 2021-09-03 DIAGNOSIS — L600 Ingrowing nail: Secondary | ICD-10-CM

## 2021-09-03 NOTE — Patient Instructions (Signed)

## 2021-09-03 NOTE — Progress Notes (Signed)
Subjective:   Patient ID: Jimmy Orr, male   DOB: 87 y.o.   MRN: [id number]   HPI Patient presents with caregiver stating he has had a painful ingrown toenail of his right big toe and is also concerned he may have been bit by something on the bottom of his big toe.  States its been sore on the nail and has been chronic and the bottom is feeling better.  Patient does not smoke and is not active   Review of Systems  All other systems reviewed and are negative.      Objective:  Physical Exam Vitals and nursing note reviewed.  Constitutional:      Appearance: He is well-developed.  Pulmonary:     Effort: Pulmonary effort is normal.  Musculoskeletal:        General: Normal range of motion.  Skin:    General: Skin is warm.  Neurological:     Mental Status: He is alert.    Neurovascular status intact muscle strength was adequate range of motion adequate.  Patient did not have any portal of entry plantar right I did not see a bite area and I did note incurvation of the right hallux medial border painful when pressed no active drainage or redness noted with some structural damage to the nail itself     Assessment:  Ingrown toenail deformity right hallux medial border with a just overall damaged right hallux nail bed     Plan:  H&P reviewed condition recommended correction of deformity.  I explained ingrown toenail correction and I infiltrated the right hallux 60 mg like Marcaine mixture and sterile prep done and using sterile sedation remove the medial border.  The rest the nail was found to be loose and came off spontaneously and I only applied chemical to the medial side applied sterile dressing instructed on soaks and leave dressing on 24 hours but take it off earlier if any throbbing were to occur and encouraged to call questions concerns which may arise

## 2022-02-10 ENCOUNTER — Emergency Department (HOSPITAL_BASED_OUTPATIENT_CLINIC_OR_DEPARTMENT_OTHER): Payer: Medicare Other

## 2022-02-10 ENCOUNTER — Emergency Department (HOSPITAL_BASED_OUTPATIENT_CLINIC_OR_DEPARTMENT_OTHER)
Admission: EM | Admit: 2022-02-10 | Discharge: 2022-02-10 | Disposition: A | Discharge status: Home / Self Care | Payer: Medicare Other | Attending: Emergency Medicine | Admitting: Emergency Medicine

## 2022-02-10 ENCOUNTER — Emergency Department (HOSPITAL_BASED_OUTPATIENT_CLINIC_OR_DEPARTMENT_OTHER): Payer: Medicare Other | Admitting: Radiology

## 2022-02-10 ENCOUNTER — Encounter (HOSPITAL_BASED_OUTPATIENT_CLINIC_OR_DEPARTMENT_OTHER): Payer: Self-pay | Admitting: Emergency Medicine

## 2022-02-10 ENCOUNTER — Other Ambulatory Visit: Payer: Self-pay

## 2022-02-10 DIAGNOSIS — U071 COVID-19: Secondary | ICD-10-CM | POA: Diagnosis not present

## 2022-02-10 DIAGNOSIS — R42 Dizziness and giddiness: Secondary | ICD-10-CM

## 2022-02-10 DIAGNOSIS — I10 Essential (primary) hypertension: Secondary | ICD-10-CM | POA: Diagnosis not present

## 2022-02-10 DIAGNOSIS — Z79899 Other long term (current) drug therapy: Secondary | ICD-10-CM | POA: Insufficient documentation

## 2022-02-10 DIAGNOSIS — R519 Headache, unspecified: Secondary | ICD-10-CM | POA: Diagnosis present

## 2022-02-10 LAB — COMPREHENSIVE METABOLIC PANEL
ALT: 17 U/L (ref 0–44)
AST: 25 U/L (ref 15–41)
Albumin: 3.6 g/dL (ref 3.5–5.0)
Alkaline Phosphatase: 60 U/L (ref 38–126)
Anion gap: 8 (ref 5–15)
BUN: 31 mg/dL — ABNORMAL HIGH (ref 8–23)
CO2: 24 mmol/L (ref 22–32)
Calcium: 8.8 mg/dL — ABNORMAL LOW (ref 8.9–10.3)
Chloride: 106 mmol/L (ref 98–111)
Creatinine, Ser: 1.59 mg/dL — ABNORMAL HIGH (ref 0.61–1.24)
GFR, Estimated: 41 mL/min — ABNORMAL LOW (ref >60)
Glucose, Bld: 96 mg/dL (ref 70–99)
Potassium: 4.8 mmol/L (ref 3.5–5.1)
Sodium: 138 mmol/L (ref 135–145)
Total Bilirubin: 0.4 mg/dL (ref 0.3–1.2)
Total Protein: 6.5 g/dL (ref 6.5–8.1)

## 2022-02-10 LAB — URINALYSIS, ROUTINE W REFLEX MICROSCOPIC
Bilirubin Urine: NEGATIVE
Glucose, UA: NEGATIVE mg/dL
Hgb urine dipstick: NEGATIVE
Ketones, ur: NEGATIVE mg/dL
Leukocytes,Ua: NEGATIVE
Nitrite: NEGATIVE
Protein, ur: 100 mg/dL — AB
Specific Gravity, Urine: 1.017 (ref 1.005–1.030)
pH: 6 (ref 5.0–8.0)

## 2022-02-10 LAB — CBC WITH DIFFERENTIAL/PLATELET
Abs Immature Granulocytes: 0.02 10*3/uL (ref 0.00–0.07)
Basophils Absolute: 0 10*3/uL (ref 0.0–0.1)
Basophils Relative: 1 %
Eosinophils Absolute: 0 10*3/uL (ref 0.0–0.5)
Eosinophils Relative: 0 %
HCT: 37.1 % — ABNORMAL LOW (ref 39.0–52.0)
Hemoglobin: 11.8 g/dL — ABNORMAL LOW (ref 13.0–17.0)
Immature Granulocytes: 0 %
Lymphocytes Relative: 28 %
Lymphs Abs: 1.7 10*3/uL (ref 0.7–4.0)
MCH: 29.5 pg (ref 26.0–34.0)
MCHC: 31.8 g/dL (ref 30.0–36.0)
MCV: 92.8 fL (ref 80.0–100.0)
Monocytes Absolute: 1.3 10*3/uL — ABNORMAL HIGH (ref 0.1–1.0)
Monocytes Relative: 21 %
Neutro Abs: 2.9 10*3/uL (ref 1.7–7.7)
Neutrophils Relative %: 50 %
Platelets: 167 10*3/uL (ref 150–400)
RBC: 4 MIL/uL — ABNORMAL LOW (ref 4.22–5.81)
RDW: 14.1 % (ref 11.5–15.5)
WBC: 5.9 10*3/uL (ref 4.0–10.5)
nRBC: 0 % (ref 0.0–0.2)

## 2022-02-10 LAB — TROPONIN I (HIGH SENSITIVITY): Troponin I (High Sensitivity): 9 ng/L (ref <18)

## 2022-02-10 LAB — RESP PANEL BY RT-PCR (FLU A&B, COVID) ARPGX2
Influenza A by PCR: NEGATIVE
Influenza B by PCR: NEGATIVE
SARS Coronavirus 2 by RT PCR: POSITIVE — AB

## 2022-02-10 MED ORDER — NIRMATRELVIR/RITONAVIR (PAXLOVID)TABLET
3.0000 | ORAL_TABLET | Freq: Two times a day (BID) | ORAL | 0 refills | Status: AC
  Filled 2022-02-10: qty 30, 5d supply, fill #0

## 2022-02-10 NOTE — ED Notes (Signed)
Patient transported to CT 

## 2022-02-10 NOTE — ED Provider Notes (Addendum)
[facility] EMERGENCY DEPT Provider Note   CSN: [id number] Arrival date & time: 02/10/22  1253     History  Chief Complaint  Patient presents with   Dizziness   Headache    Jimmy Orr is a 88 y.o. male.  Pt reports he has a history of tinnitus.  Pt reports yesterday he had a pain in the back of his head and felt dizzy.  Pt reports symptoms resolved after one hour.  Pt states he feels back to normal.  No extremity weakness, no facial weakness   The history is provided by the patient.  Dizziness Quality:  Unable to specify Severity:  Moderate Onset quality:  Sudden Duration:  1 day Timing:  Constant Progression:  Worsening Chronicity:  New Relieved by:  Nothing Worsened by:  Nothing Ineffective treatments:  None tried Associated symptoms: headaches and tinnitus   Associated symptoms: no chest pain, no nausea, no palpitations, no shortness of breath, no vision changes and no weakness   Headache Pain location:  Occipital Quality:  Sharp Radiates to:  Does not radiate Severity currently:  0/10 Severity at highest:  Unable to specify Onset quality:  Sudden Duration:  1 hour Timing:  Constant Chronicity:  New Context: not coughing   Relieved by:  Nothing Worsened by:  Nothing Associated symptoms: dizziness   Associated symptoms: no nausea, no visual change and no weakness       Home Medications Prior to Admission medications   Medication Sig Start Date End Date Taking? Authorizing Provider  lisinopril (PRINIVIL,ZESTRIL) 10 MG tablet Take 10 mg by mouth daily.   Yes [provider]  metoprolol succinate (TOPROL-XL) 25 MG 24 hr tablet Take 25 mg by mouth at bedtime.   Yes [provider]  Vitamins-Lipotropics (LIPO FLAVONOID PLUS PO) Take 2 tablets by mouth 3 (three) times daily.   Yes [provider]  triamcinolone cream (KENALOG) 0.1 % SMARTSIG:Sparingly Topical Twice Daily PRN 05/26/21   [provider]       Allergies    Morphine and related, Niacin, and Statins    Review of Systems   Review of Systems  HENT:  Positive for tinnitus.   Respiratory:  Negative for shortness of breath.   Cardiovascular:  Negative for chest pain and palpitations.  Gastrointestinal:  Negative for nausea.  Neurological:  Positive for dizziness and headaches. Negative for weakness.  All other systems reviewed and are negative.  Physical Exam Updated Vital Signs BP (!) 178/78 (BP Location: Right Arm)    Pulse 66    Temp 98.8 F (37.1 C)    Resp 18    Ht 5\' 10"  (1.778 m)    Wt 87.8 kg    SpO2 95%    BMI 27.77 kg/m  Physical Exam Vitals and nursing note reviewed.  Constitutional:      Appearance: He is well-developed.  HENT:     Head: Normocephalic.  Eyes:     Extraocular Movements: Extraocular movements intact.     Pupils: Pupils are equal, round, and reactive to light.  Cardiovascular:     Rate and Rhythm: Normal rate and regular rhythm.     Heart sounds: Normal heart sounds.  Pulmonary:     Effort: Pulmonary effort is normal.  Abdominal:     General: There is no distension.  Musculoskeletal:        General: Normal range of motion.     Cervical back: Normal range of motion.  Neurological:     Mental  Status: He is alert and oriented to person, place, and time.  Psychiatric:        Mood and Affect: Mood normal.    ED Results / Procedures / Treatments   Labs (all labs ordered are listed, but only abnormal results are displayed) Labs Reviewed  CBC WITH DIFFERENTIAL/PLATELET - Abnormal; Notable for the following components:      Result Value   RBC 4.00 (*)    Hemoglobin 11.8 (*)    HCT 37.1 (*)    Monocytes Absolute 1.3 (*)    All other components within normal limits  COMPREHENSIVE METABOLIC PANEL - Abnormal; Notable for the following components:   BUN 31 (*)    Creatinine, Ser 1.59 (*)    Calcium 8.8 (*)    GFR, Estimated 41 (*)    All other components within normal limits  URINALYSIS,  ROUTINE W REFLEX MICROSCOPIC - Abnormal; Notable for the following components:   Protein, ur 100 (*)    Bacteria, UA RARE (*)    All other components within normal limits  RESP PANEL BY RT-PCR (FLU A&B, COVID) ARPGX2  TROPONIN I (HIGH SENSITIVITY)  TROPONIN I (HIGH SENSITIVITY)    EKG None  Radiology DG Chest 2 View  Result Date: 02/10/2022 CLINICAL DATA:  Cough, history of hypertension EXAM: CHEST - 2 VIEW COMPARISON:  Chest radiograph 02/13/2005 FINDINGS: The heart is mildly enlarged. The upper mediastinal contours are normal. There is no focal consolidation or pulmonary edema. There is no pleural effusion or pneumothorax. There is no acute osseous abnormality. IMPRESSION: Mild cardiomegaly. Otherwise, no radiographic evidence of acute cardiopulmonary process. Electronically Signed   By: Lesia Hausen M.D.   On: 02/10/2022 14:29   CT Head Wo Contrast  Result Date: 02/10/2022 CLINICAL DATA:  Headache tension type. Dizziness. Symptoms started yesterday at 1630 hours. Symptoms have since resolved. EXAM: CT HEAD WITHOUT CONTRAST TECHNIQUE: Contiguous axial images were obtained from the base of the skull through the vertex without intravenous contrast. RADIATION DOSE REDUCTION: This exam was performed according to the departmental dose-optimization program which includes automated exposure control, adjustment of the mA and/or kV according to patient size and/or use of iterative reconstruction technique. COMPARISON:  CT brain 04/01/2016 FINDINGS: Brain: There is mild cortical atrophy, within normal limits for patient age. The ventricles are normal in configuration. The basilar cisterns are patent. No mass, mass effect, or midline shift. No acute intracranial hemorrhage is seen. No abnormal extra-axial fluid collection. Mild periventricular white matter hypodensities are similar to prior nonspecific but most likely secondary to chronic ischemic white matter changes. Preservation of the normal cortical  gray-white interface without CT evidence of an acute major vascular territorial cortical based infarction. Vascular: There are atherosclerotic calcifications within the major skull base arteries. Skull: Normal. Negative for fracture or focal lesion. Sinuses/Orbits: Status post bilateral ocular lens replacements. New mild to moderate mucosal thickening throughout the ethmoid air cells. Minimal circumferential peripheral mucosal thickening of the partially visualized maxillary sinuses. Mild opacification of a few bilateral inferior mastoid air cells. Other: None. IMPRESSION:: IMPRESSION: 1. No acute intracranial process. 2. Mild-to-moderate ethmoid air cell mucosal opacification. Electronically Signed   By: Neita Garnet M.D.   On: 02/10/2022 14:14    Procedures Procedures    Medications Ordered in ED Medications - No data to display  ED Course/ Medical Decision Making/ A&P  Medical Decision Making Pt reports he had dizziness and pain in the back of his head yesterday   Problems Addressed: Dizziness: self-limited or minor problem    Details: symptoms lasted for 1 hour and resolved  Amount and/or Complexity of Data Reviewed Independent Historian: spouse    Details: Pt's wife mentions concern for covid. External Data Reviewed: notes.    Details: notes from primary care reviewed Labs: ordered. Radiology: ordered and independent interpretation performed. Decision-making details documented in ED Course.    Details: Chest xray  ordered reviewed and interpreted, CT head shows some mucosal thickening,  Results reviewed,  I don't think pt has acute sinusitis ECG/medicine tests: ordered and independent interpretation performed. Decision-making details documented in ED Course.  Risk Risk Details: Pt states he wants to go home.  He feels well and does not want to be out past dark.  Covid is pending. Pt has elevated blood pressure.  I advised follow up with primary care for  recheck of blood pressure         Pt's covid test returned an is positive.  I spoke to pt's wife about results.  Rx for paxlovid called to [facility].     Final Clinical Impression(s) / ED Diagnoses Final diagnoses:  Dizziness  Hypertension, unspecified type    Rx / DC Orders ED Discharge Orders     None      An After Visit Summary was printed and given to the patient.    Elson Areas, PA-C 02/10/22 1738    Osie Cheeks 02/10/22 Margretta Ditty    Lorre Nick, MD 02/12/22 [phone]

## 2022-02-10 NOTE — ED Triage Notes (Signed)
Pt arrives to ED with c/o headache, dizziness, and feeling off balance that started yesterday at 1630, 3/1. Pt with hx of tinnitus. Pt reports he had an episode of severe posterior headache that caused him to become off balanced and dizzy. Pt reports these symptoms lasted one hour yesterday and have since resolved. He feels back to baseline today.  ?

## 2022-02-10 NOTE — Discharge Instructions (Signed)
See your Physicain for recheck next week.  

## 2022-02-11 ENCOUNTER — Other Ambulatory Visit (HOSPITAL_BASED_OUTPATIENT_CLINIC_OR_DEPARTMENT_OTHER): Payer: Self-pay

## 2023-06-04 ENCOUNTER — Encounter (HOSPITAL_COMMUNITY): Payer: Self-pay | Admitting: *Deleted

## 2023-06-04 ENCOUNTER — Other Ambulatory Visit: Payer: Self-pay

## 2023-06-04 ENCOUNTER — Emergency Department (HOSPITAL_COMMUNITY)
Admission: EM | Admit: 2023-06-04 | Discharge: 2023-06-04 | Disposition: A | Discharge status: Home / Self Care | Payer: Medicare Other | Attending: Emergency Medicine | Admitting: Emergency Medicine

## 2023-06-04 DIAGNOSIS — I10 Essential (primary) hypertension: Secondary | ICD-10-CM | POA: Diagnosis not present

## 2023-06-04 DIAGNOSIS — W2203XA Walked into furniture, initial encounter: Secondary | ICD-10-CM | POA: Insufficient documentation

## 2023-06-04 DIAGNOSIS — E119 Type 2 diabetes mellitus without complications: Secondary | ICD-10-CM | POA: Diagnosis not present

## 2023-06-04 DIAGNOSIS — Z79899 Other long term (current) drug therapy: Secondary | ICD-10-CM | POA: Diagnosis not present

## 2023-06-04 DIAGNOSIS — S61412A Laceration without foreign body of left hand, initial encounter: Secondary | ICD-10-CM | POA: Diagnosis present

## 2023-06-04 NOTE — Discharge Instructions (Signed)
As discussed, continue wound care at home changing out bandages daily.  You may replace steristrips if they become loose.  Wash area gently with warm soapy water.  Please do not hesitate to return to emergency department for worrisome signs and symptoms we discussed become apparent.

## 2023-06-04 NOTE — ED Notes (Signed)
Flushed wound on left hand with normal saline and Dermal Wound Cleanser.

## 2023-06-04 NOTE — ED Provider Notes (Signed)
[place] EMERGENCY DEPARTMENT AT [facility] Provider Note   CSN: [id number] Arrival date & time: 06/04/23  1204     History  Chief Complaint  Patient presents with   Skin Tear    Jimmy Orr is a 89 y.o. male.  HPI   89 year old male presents emergency department with complaints of skin tear on left hand.  Patient states that he was attempting to prevent his wife from falling in the bathroom yesterday morning when his left hand got pinned in the door frame between his wife.  States that he suffered a skin tear to the backside of his left hand.  Area was dressed by the fire department who visited his residence to take care of his wife and him.  States that his wife is currently in the ICU and was just able to be seen for his skin tear on his left hand.  States that he had area cleaned by fire department yesterday but has not changed dressing since that time.  Denies any pain in right hand, weakness or sensory deficits in left hand.  States that area has not been excessively draining/bleeding since bandage was placed.  Denies trauma elsewhere.  Patient's last tetanus vaccination 7 years ago.  Past medical history significant for diabetes mellitus, hyperlipidemia, hypertension  Home Medications Prior to Admission medications   Medication Sig Start Date End Date Taking? Authorizing Provider  lisinopril (PRINIVIL,ZESTRIL) 10 MG tablet Take 10 mg by mouth daily.    [provider]  metoprolol succinate (TOPROL-XL) 25 MG 24 hr tablet Take 25 mg by mouth at bedtime.    [provider]  triamcinolone cream (KENALOG) 0.1 % SMARTSIG:Sparingly Topical Twice Daily PRN 05/26/21   [provider]  Vitamins-Lipotropics (LIPO FLAVONOID PLUS PO) Take 2 tablets by mouth 3 (three) times daily.    [provider]      Allergies    Morphine and codeine, Niacin, and Statins    Review of Systems   Review of Systems  All other systems reviewed and are  negative.   Physical Exam Updated Vital Signs BP 138/68   Pulse 63   Temp 97.9 F (36.6 C) (Oral)   Resp 17   Ht 5\' 11"  (1.803 m)   SpO2 96%   BMI 27.00 kg/m  Physical Exam Vitals and nursing note reviewed.  Constitutional:      General: He is not in acute distress.    Appearance: He is well-developed.  HENT:     Head: Normocephalic and atraumatic.  Eyes:     Conjunctiva/sclera: Conjunctivae normal.  Cardiovascular:     Rate and Rhythm: Normal rate and regular rhythm.  Pulmonary:     Effort: Pulmonary effort is normal. No respiratory distress.     Breath sounds: Normal breath sounds.  Abdominal:     Palpations: Abdomen is soft.     Tenderness: There is no abdominal tenderness.  Musculoskeletal:        General: No swelling.     Cervical back: Neck supple.     Comments: Patient with full range of motion of left hand/wrist.  Patient will make fist, thumbs up, okay sign, resist horizontal adduction of digits, extend wrist, oppose thumb without appreciable weakness or difficulty.  7.5 to 7.8 cm linear skin tear appreciated on dorsal aspect of left hand.  Area very superficial in nature with no obvious ligamentous/tendinous involvement.  No obvious foreign body.  No purulent drainage.  Mild oozing of bright red blood  appreciated when area was manipulated.  No obvious bony tenderness of the hand or wrist.  Radial pulses 2+ bilaterally.  Capillary fill less than 2 seconds.  Skin:    General: Skin is warm and dry.     Capillary Refill: Capillary refill takes less than 2 seconds.  Neurological:     Mental Status: He is alert.  Psychiatric:        Mood and Affect: Mood normal.     ED Results / Procedures / Treatments   Labs (all labs ordered are listed, but only abnormal results are displayed) Labs Reviewed - No data to display  EKG None  Radiology No results found.  Procedures .Marland KitchenLaceration Repair  Date/Time: 06/04/2023 1:28 PM  Performed by: Peter Garter,  PA Authorized by: Peter Garter, PA   Consent:    Consent obtained:  Verbal   Consent given by:  Patient   Risks, benefits, and alternatives were discussed: yes     Risks discussed:  Need for additional repair, infection, nerve damage, pain, retained foreign body, tendon damage, vascular damage, poor wound healing and poor cosmetic result   Alternatives discussed:  No treatment, delayed treatment, observation and referral Universal protocol:    Procedure explained and questions answered to patient or proxy's satisfaction: yes     Patient identity confirmed:  Verbally with patient Anesthesia:    Anesthesia method:  None Laceration details:    Location:  Hand   Hand location:  L hand, dorsum   Length (cm):  7.8 Exploration:    Limited defect created (wound extended): no     Hemostasis achieved with:  Direct pressure   Imaging outcome: foreign body not noted     Wound exploration: wound explored through full range of motion and entire depth of wound visualized     Contaminated: no   Treatment:    Area cleansed with:  Saline   Amount of cleaning:  Standard   Visualized foreign bodies/material removed: no     Debridement:  None   Undermining:  None   Scar revision: no   Skin repair:    Repair method:  Steri-Strips   Number of Steri-Strips:  3 Approximation:    Approximation:  Loose Repair type:    Repair type:  Simple Post-procedure details:    Dressing:  Bulky dressing and non-adherent dressing   Procedure completion:  Tolerated well, no immediate complications     Medications Ordered in ED Medications - No data to display  ED Course/ Medical Decision Making/ A&P                             Medical Decision Making  This patient presents to the ED for concern of skin tear, this involves an extensive number of treatment options, and is a complaint that carries with it a high risk of complications and morbidity.  The differential diagnosis includes skin tear,  laceration, foreign body retainment, ligamentous/tendinous injury, neurovascular compromise, fracture, dislocation   Co morbidities that complicate the patient evaluation  See HPI   Additional history obtained:  Additional history obtained from EMR External records from outside source obtained and reviewed including hospital records   Lab Tests:  N/a   Imaging Studies ordered:  N/a   Cardiac Monitoring: / EKG:  The patient was maintained on a cardiac monitor.  I personally viewed and interpreted the cardiac monitored which showed an underlying rhythm of: Sinus rhythm   Consultations  Obtained:  N/a   Problem List / ED Course / Critical interventions / Medication management  Skin tear Reevaluation of the patient showed that the patient stayed the same I have reviewed the patients home medicines and have made adjustments as needed   Social Determinants of Health:  Denies tobacco, licit drug use   Test / Admission - Considered:  Skin tear Vitals signs within normal range and stable throughout visit. Laboratory/imaging studies significant for: Hospital records 89 year old male presents emergency department after injury to left hand.  Patient with evidence of superficial skin tear without obvious ligamentous/tendinous injury.  Patient without underlying bony tenderness to palpation.  Shared decision conversation was had regarding x-ray imaging of patient's left hand given injury but due to lack of bony tenderness, loss of range of motion affected hand, weakness, superficial nature of care, patient declined any additional imaging.  Wound was repaired in manner as depicted above.  Patient educated regarding proper wound care at home and recommended follow-up with primary care for reassessment of wound.  Treatment plan discussed at length with patient and he acknowledged understanding was agreeable to said plan.  Patient overall well-appearing, febrile no acute  distress. Worrisome signs and symptoms were discussed with the patient, and the patient acknowledged understanding to return to the ED if noticed. Patient was stable upon discharge.          Final Clinical Impression(s) / ED Diagnoses Final diagnoses:  Skin tear of left hand without complication, initial encounter    Rx / DC Orders ED Discharge Orders     None         Peter Garter, [place] 06/04/23 1329    Gloris Manchester, MD 06/04/23 1527

## 2023-06-04 NOTE — ED Triage Notes (Signed)
Pt was trying to keep his wife from falling yesterday morning, Hit hand against door jam, skin tear to back of left hand, EMS applied dressing to area.

## 2023-06-08 IMAGING — CT CT HEAD W/O CM
4 series · 15 of 47 positions shown, 17 images · non-contrast
Comparison: CT brain 04/01/2016

CLINICAL DATA: Headache tension type. Dizziness. Symptoms started
yesterday at 2396 hours. Symptoms have since resolved.



[Series 2: head wo · axial · 0.43mm/px · z∈[+1121,+1241]mm · 7 of 33 slices shown, 9 images]
[im 5/33  brain]
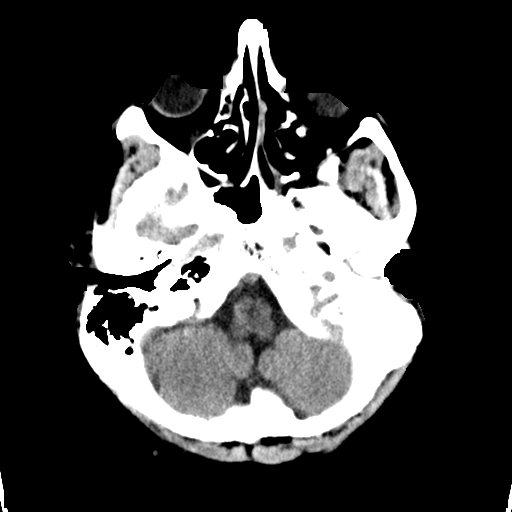
[im 5/33  bone]
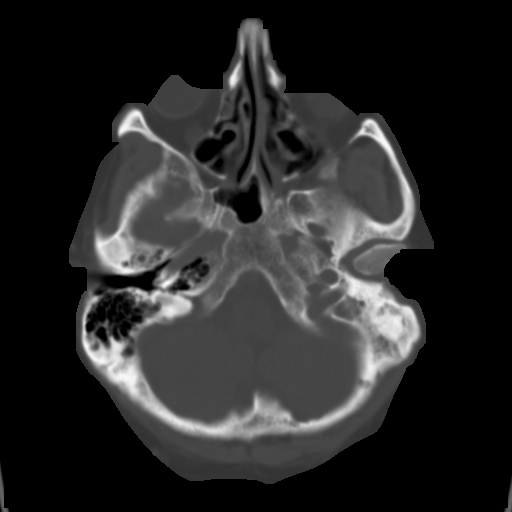
[im 9/33  brain]
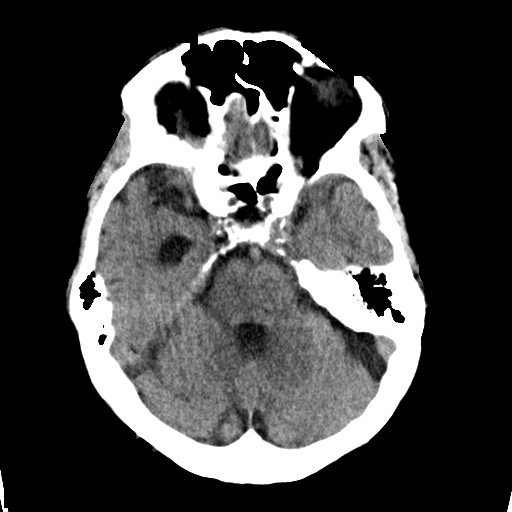
[im 13/33  brain]
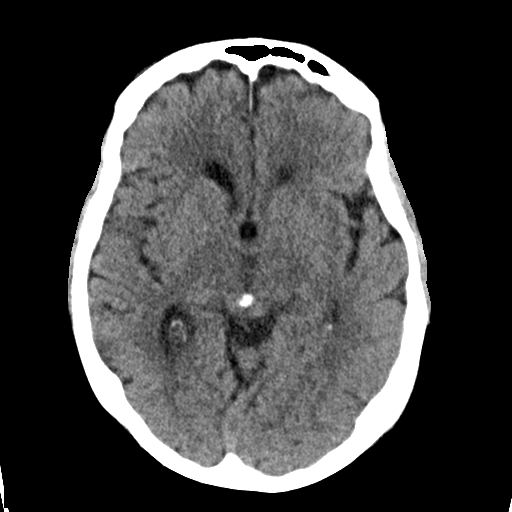
[im 17/33  brain]
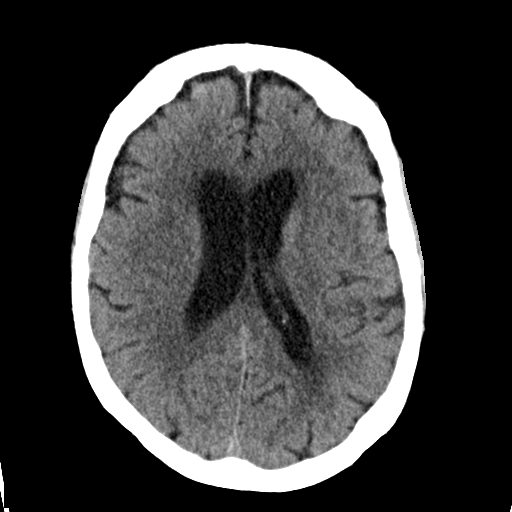
[im 21/33  brain]
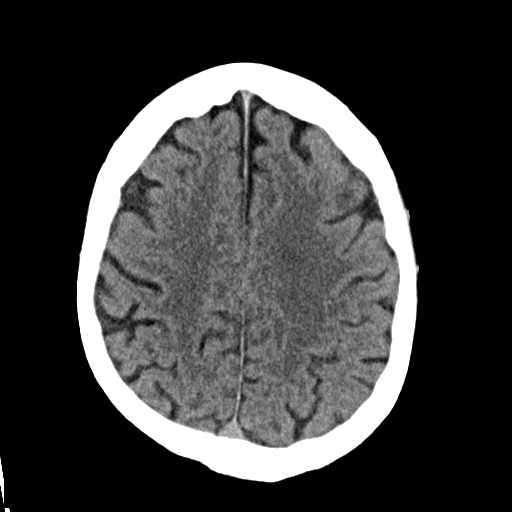
[im 21/33  bone]
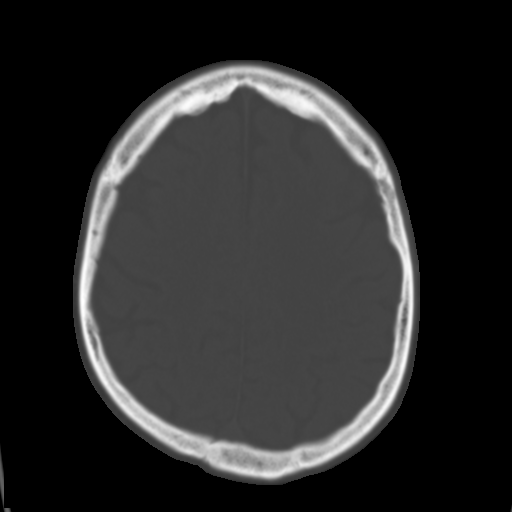
[im 25/33  brain]
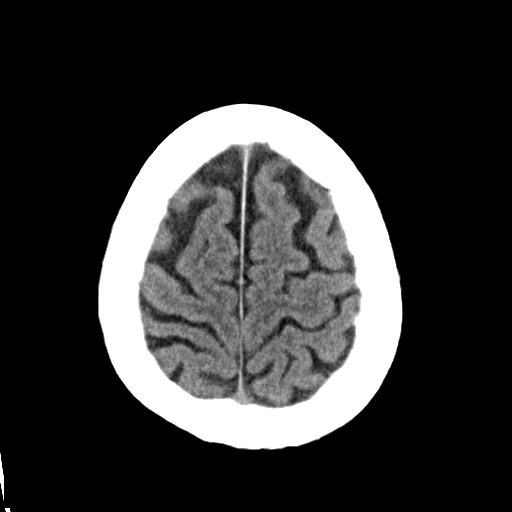
[im 29/33  brain]
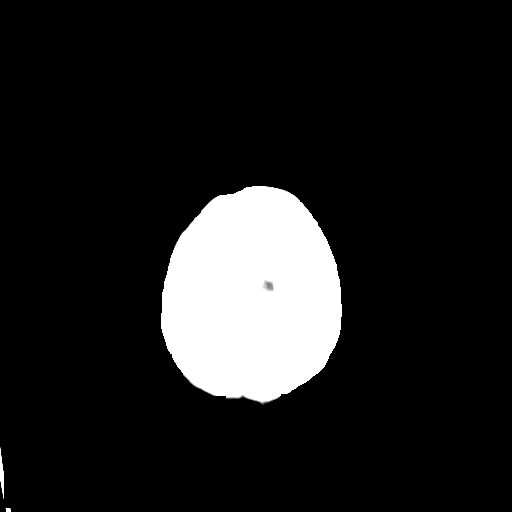

[Series 3: coronal soft · coronal · 0.30mm/px · 3 of 71 slices shown]
[im 24/71  brain]
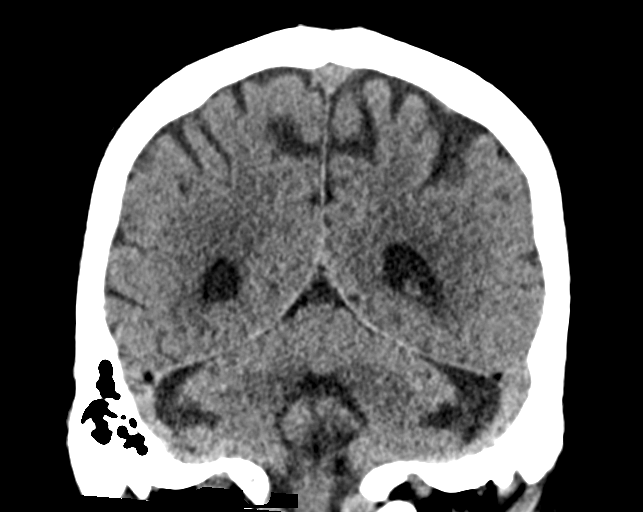
[im 32/71  brain]
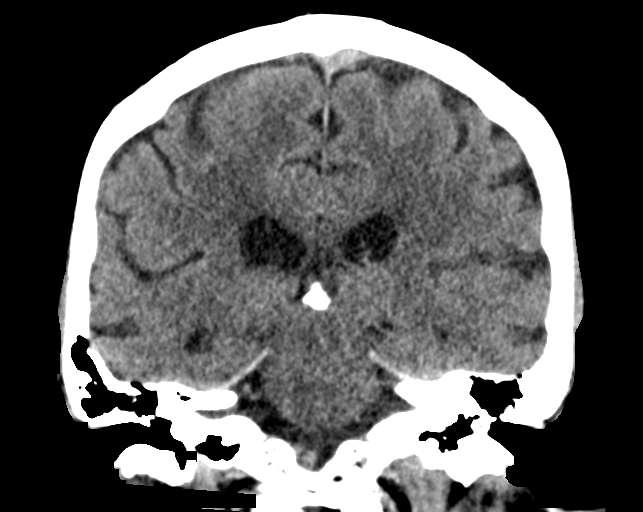
[im 39/71  brain]
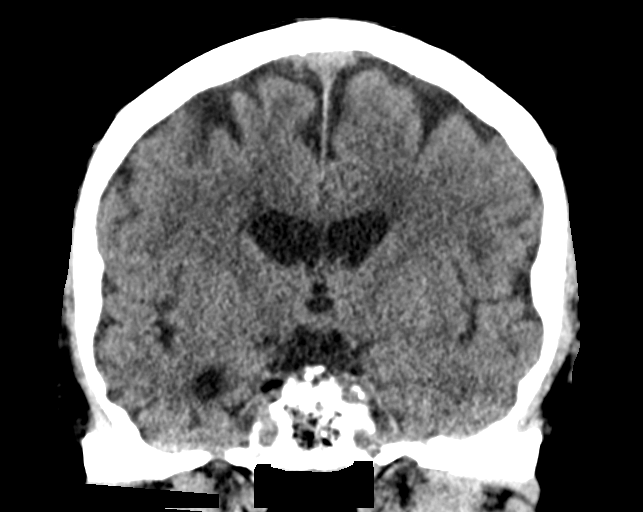

[Series 4: head bone · axial · 0.43mm/px · z∈[+1117,+1133]mm · 2 of 83 slices shown]
[im 9/83  bone]
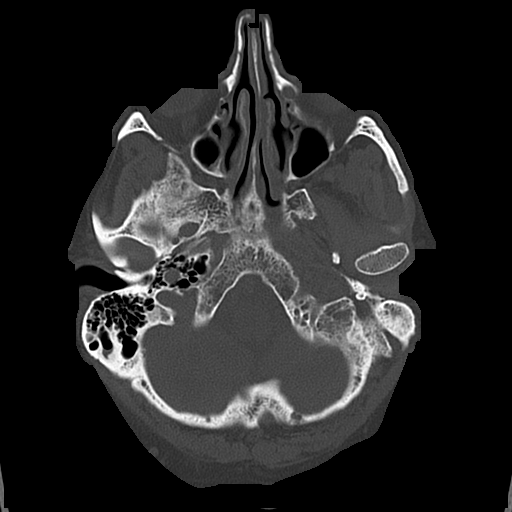
[im 17/83  bone]
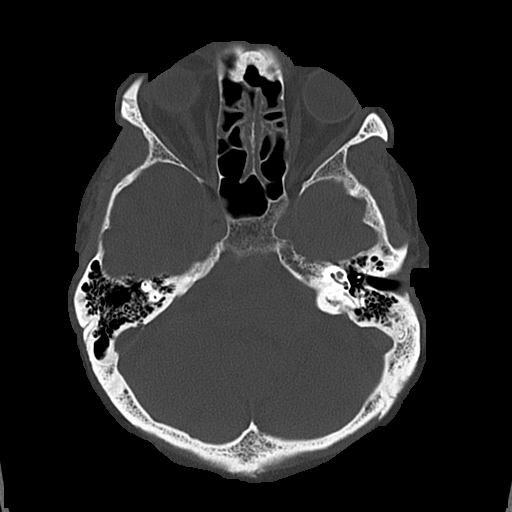

[Series 5: sagittal soft · sagittal · 0.32mm/px · 3 of 60 slices shown]
[im 20/60  brain]
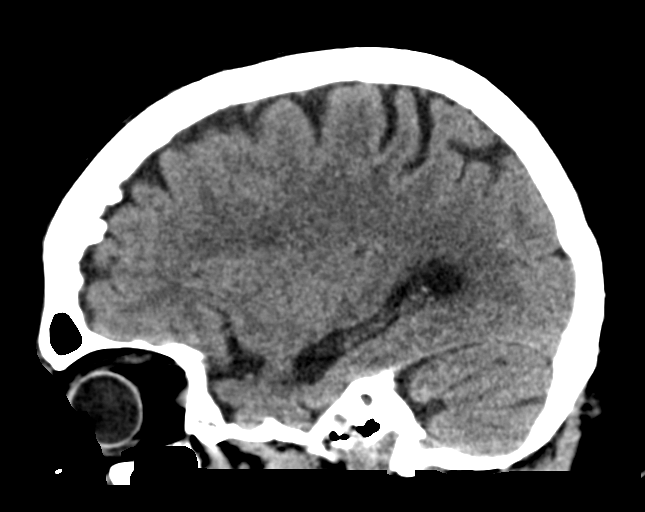
[im 30/60  brain]
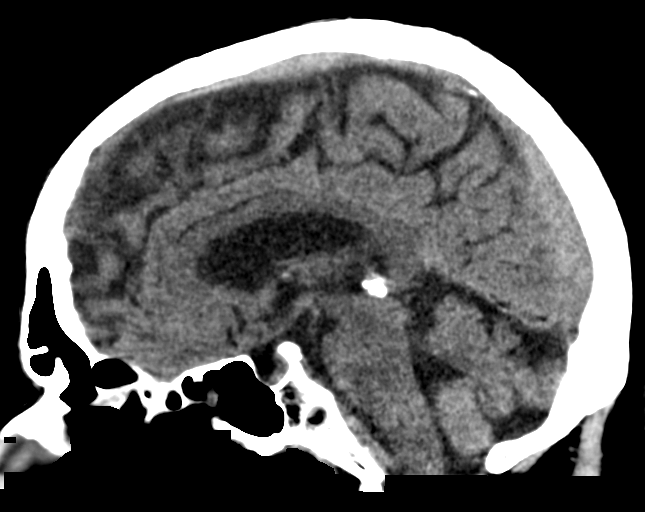
[im 40/60  brain]
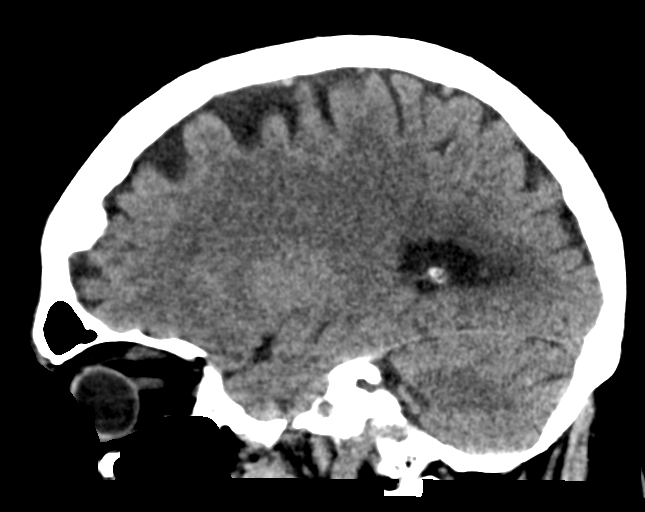

[15 of 47 positions shown; findings below may reference images not displayed]

FINDINGS: Brain: There is mild cortical atrophy, within normal limits for
patient age. The ventricles are normal in configuration. The basilar
cisterns are patent.

No mass, mass effect, or midline shift.

No acute intracranial hemorrhage is seen.

No abnormal extra-axial fluid collection.

Mild periventricular white matter hypodensities are similar to prior
nonspecific but most likely secondary to chronic ischemic white
matter changes.

Preservation of the normal cortical gray-white interface without CT
evidence of an acute major vascular territorial cortical based
infarction.

Vascular: There are atherosclerotic calcifications within the major
skull base arteries.

Skull: Normal. Negative for fracture or focal lesion.

Sinuses/Orbits: Status post bilateral ocular lens replacements. New
mild to moderate mucosal thickening throughout the ethmoid air
cells. Minimal circumferential peripheral mucosal thickening of the
partially visualized maxillary sinuses. Mild opacification of a few
bilateral inferior mastoid air cells.

Other: None.
IMPRESSION: :
IMPRESSION: 1. No acute intracranial process.
2. Mild-to-moderate ethmoid air cell mucosal opacification.

## 2023-11-29 ENCOUNTER — Encounter: Payer: Self-pay | Admitting: Physician Assistant

## 2023-12-14 NOTE — Progress Notes (Signed)
 Patient refused evaluation, vitals. Never examined or tested. No charge LOS

## 2023-12-15 ENCOUNTER — Ambulatory Visit: Payer: Medicare Other

## 2023-12-15 ENCOUNTER — Encounter: Payer: Self-pay | Admitting: Physician Assistant

## 2023-12-15 ENCOUNTER — Ambulatory Visit (INDEPENDENT_AMBULATORY_CARE_PROVIDER_SITE_OTHER): Payer: Medicare Other | Admitting: Physician Assistant

## 2023-12-15 DIAGNOSIS — R413 Other amnesia: Secondary | ICD-10-CM

## 2023-12-15 NOTE — Progress Notes (Signed)
 Patient came accompanied by caregiver.  Unfortunately, the patient did not want to proceed with the visit stating I am brought here against my will I do not want to be here, I do not want to have an exam .  He refused vital signs as well.  Per caregivers report, the patient's wife wanted him to be seen for evaluation for competency.  We explained that we cannot declare the patient competent or incompetent and that is done by geriatric psychiatrist,   Dr. Rosaline Nine for assessment of decision of mental capacity and competency [phone].  Caregiver was appreciative of the information and will refer to his wife.  Discussed with our office manager, Elveria Senters, who agreed that the patient cannot be evaluated against his will.

## 2023-12-27 ENCOUNTER — Ambulatory Visit: Payer: Medicare Other

## 2023-12-27 ENCOUNTER — Ambulatory Visit: Payer: Medicare Other | Admitting: Physician Assistant

## 2024-04-02 ENCOUNTER — Encounter: Payer: Self-pay | Admitting: Physician Assistant

## 2024-04-02 ENCOUNTER — Ambulatory Visit: Admitting: Physician Assistant

## 2024-04-02 ENCOUNTER — Other Ambulatory Visit

## 2024-04-02 ENCOUNTER — Ambulatory Visit

## 2024-04-02 VITALS — HR 62 | Resp 20 | Ht 70.0 in | Wt 160.0 lb

## 2024-04-02 DIAGNOSIS — R413 Other amnesia: Secondary | ICD-10-CM

## 2024-04-02 NOTE — Patient Instructions (Addendum)
 It was a pleasure to see you today at our office.   Recommendations:   CT head  without contrast to assess for underlying structural abnormality and assess vascular load [phone] at [facility]  Check B12, TSH [address] Continue donepezil, recommend increasing to 10 mg a daily for better coverage.  Continue to check hearing  Recommend good control of cardiovascular risk factors.   Continue to control mood as per PCP, if worsening hallucinations, consider Seroquel or Depakote  Folllow up in  6 months  Recommend visiting the website : " Dementia Success [facility]" to better understand some behaviors related to memory loss.    For guidance regarding [facility] and if placement were needed at the facility, contact Forrestine Ike, Social Worker tel: [phone]   For assessment of decision of mental capacity and competency:  Call Dr. Laverne Potter, geriatric psychiatrist at [phone] Counseling regarding caregiver distress, including caregiver depression, anxiety and issues regarding community resources, adult day care programs, adult living facilities, or memory care questions:  please contact your  Primary Doctor's Social Worker   Whom to call: Memory  decline, memory medications: Call our office [phone]  For psychiatric meds, mood meds: Please have your primary care physician manage these medications.  If you have any severe symptoms of a stroke, or other severe issues such as confusion,severe chills or fever, etc call 911 or go to the ER as you may need to be evaluated further   [email or web address]   RECOMMENDATIONS FOR ALL PATIENTS WITH MEMORY PROBLEMS: 1. Continue to exercise (Recommend 30 minutes of walking everyday, or 3 hours every week) 2. Increase social interactions - continue going to [place] and enjoy social gatherings with friends and family 3. Eat healthy, avoid fried foods and eat more  fruits and vegetables 4. Maintain adequate blood pressure, blood sugar, and blood cholesterol level. Reducing the risk of stroke and cardiovascular disease also helps promoting better memory. 5. Avoid stressful situations. Live a simple life and avoid aggravations. Organize your time and prepare for the next day in anticipation. 6. Sleep well, avoid any interruptions of sleep and avoid any distractions in the bedroom that may interfere with adequate sleep quality 7. Avoid sugar, avoid sweets as there is a strong link between excessive sugar intake, diabetes, and cognitive impairment We discussed the Mediterranean diet, which has been shown to help patients reduce the risk of progressive memory disorders and reduces cardiovascular risk. This includes eating fish, eat fruits and green leafy vegetables, nuts like almonds and hazelnuts, walnuts, and also use olive oil. Avoid fast foods and fried foods as much as possible. Avoid sweets and sugar as sugar use has been linked to worsening of memory function.  There is always a concern of gradual progression of memory problems. If this is the case, then we may need to adjust level of care according to patient needs. Support, both to the patient and caregiver, should then be put into place.    The Alzheimer's Association is here all day, every day for people facing Alzheimer's disease through our free 24/7 Helpline: [phone]. The Helpline provides reliable information and support to all those who need assistance, such as individuals living with memory loss, Alzheimer's or other dementia, caregivers, health care professionals and the public.  Our highly trained and knowledgeable staff can help you with: Understanding memory loss, dementia and Alzheimer's  Medications and other treatment options  General information about aging and brain health  Skills to provide quality care and  to find the best care from professionals  Legal, financial and  living-arrangement decisions Our Helpline also features: Confidential care consultation provided by master's level clinicians who can help with decision-making support, crisis assistance and education on issues families face every day  Help in a caller's preferred language using our translation service that features more than 200 languages and dialects  Referrals to local community programs, services and ongoing support     FALL PRECAUTIONS: Be cautious when walking. Scan the area for obstacles that may increase the risk of trips and falls. When getting up in the mornings, sit up at the edge of the bed for a few minutes before getting out of bed. Consider elevating the bed at the head end to avoid drop of blood pressure when getting up. Walk always in a well-lit room (use night lights in the walls). Avoid area rugs or power cords from appliances in the middle of the walkways. Use a walker or a cane if necessary and consider physical therapy for balance exercise. Get your eyesight checked regularly.  FINANCIAL OVERSIGHT: Supervision, especially oversight when making financial decisions or transactions is also recommended.  HOME SAFETY: Consider the safety of the kitchen when operating appliances like stoves, microwave oven, and blender. Consider having supervision and share cooking responsibilities until no longer able to participate in those. Accidents with firearms and other hazards in the house should be identified and addressed as well.   ABILITY TO BE LEFT ALONE: If patient is unable to contact 911 operator, consider using LifeLine, or when the need is there, arrange for someone to stay with patients. Smoking is a fire hazard, consider supervision or cessation. Risk of wandering should be assessed by caregiver and if detected at any point, supervision and safe proof recommendations should be instituted.  MEDICATION SUPERVISION: Inability to self-administer medication needs to be constantly  addressed. Implement a mechanism to ensure safe administration of the medications.   DRIVING: Regarding driving, in patients with progressive memory problems, driving will be impaired. We advise to have someone else do the driving if trouble finding directions or if minor accidents are reported. Independent driving assessment is available to determine safety of driving.   If you are interested in the driving assessment, you can contact the following:  [employer] in [place] [phone]   [facility] [phone] or [phone]      Mediterranean Diet A Mediterranean diet refers to food and lifestyle choices that are based on the traditions of countries located on the [employer]. This way of eating has been shown to help prevent certain conditions and improve outcomes for people who have chronic diseases, like kidney disease and heart disease. What are tips for following this plan? Lifestyle  Cook and eat meals together with your family, when possible. Drink enough fluid to keep your urine clear or pale yellow. Be physically active every day. This includes: Aerobic exercise like running or swimming. Leisure activities like gardening, walking, or housework. Get 7-8 hours of sleep each night. If recommended by your health care provider, drink red wine in moderation. This means 1 glass a day for nonpregnant women and 2 glasses a day for men. A glass of wine equals 5 oz (150 mL). Reading food labels  Check the serving size of packaged foods. For foods such as rice and pasta, the serving size refers to the amount of cooked product, not dry. Check the total fat in packaged foods. Avoid foods that have  saturated fat or trans fats. Check the ingredients list for added sugars, such as corn syrup. Shopping  At the grocery store, buy most of your food from the areas near the walls of the store. This includes: Fresh fruits and  vegetables (produce). Grains, beans, nuts, and seeds. Some of these may be available in unpackaged forms or large amounts (in bulk). Fresh seafood. Poultry and eggs. Low-fat dairy products. Buy whole ingredients instead of prepackaged foods. Buy fresh fruits and vegetables in-season from local farmers markets. Buy frozen fruits and vegetables in resealable bags. If you do not have access to quality fresh seafood, buy precooked frozen shrimp or canned fish, such as tuna, salmon, or sardines. Buy small amounts of raw or cooked vegetables, salads, or olives from the deli or salad bar at your store. Stock your pantry so you always have certain foods on hand, such as olive oil, canned tuna, canned tomatoes, rice, pasta, and beans. Cooking  Cook foods with extra-virgin olive oil instead of using butter or other vegetable oils. Have meat as a side dish, and have vegetables or grains as your main dish. This means having meat in small portions or adding small amounts of meat to foods like pasta or stew. Use beans or vegetables instead of meat in common dishes like chili or lasagna. Experiment with different cooking methods. Try roasting or broiling vegetables instead of steaming or sauteing them. Add frozen vegetables to soups, stews, pasta, or rice. Add nuts or seeds for added healthy fat at each meal. You can add these to yogurt, salads, or vegetable dishes. Marinate fish or vegetables using olive oil, lemon juice, garlic, and fresh herbs. Meal planning  Plan to eat 1 vegetarian meal one day each week. Try to work up to 2 vegetarian meals, if possible. Eat seafood 2 or more times a week. Have healthy snacks readily available, such as: Vegetable sticks with hummus. Greek yogurt. Fruit and nut trail mix. Eat balanced meals throughout the week. This includes: Fruit: 2-3 servings a day Vegetables: 4-5 servings a day Low-fat dairy: 2 servings a day Fish, poultry, or lean meat: 1 serving a  day Beans and legumes: 2 or more servings a week Nuts and seeds: 1-2 servings a day Whole grains: 6-8 servings a day Extra-virgin olive oil: 3-4 servings a day Limit red meat and sweets to only a few servings a month What are my food choices? Mediterranean diet Recommended Grains: Whole-grain pasta. Brown rice. Bulgar wheat. Polenta. Couscous. Whole-wheat bread. Dwyane Glad. Vegetables: Artichokes. Beets. Broccoli. Cabbage. Carrots. Eggplant. Green beans. Chard. Kale. Spinach. Onions. Leeks. Peas. Squash. Tomatoes. Peppers. Radishes. Fruits: Apples. Apricots. Avocado. Berries. Bananas. Cherries. Dates. Figs. Grapes. Lemons. Melon. Oranges. Peaches. Plums. Pomegranate. Meats and other protein foods: Beans. Almonds. Sunflower seeds. Pine nuts. Peanuts. Cod. Salmon. Scallops. Shrimp. Tuna. Tilapia. Clams. Oysters. Eggs. Dairy: Low-fat milk. Cheese. Greek yogurt. Beverages: Water. Red wine. Herbal tea. Fats and oils: Extra virgin olive oil. Avocado oil. Grape seed oil. Sweets and desserts: [place] yogurt with honey. Baked apples. Poached pears. Trail mix. Seasoning and other foods: Basil. Cilantro. Coriander. Cumin. Mint. Parsley. Sage. Rosemary. Tarragon. Garlic. Oregano. Thyme. Pepper. Balsalmic vinegar. Tahini. Hummus. Tomato sauce. Olives. Mushrooms. Limit these Grains: Prepackaged pasta or rice dishes. Prepackaged cereal with added sugar. Vegetables: Deep fried potatoes (french fries). Fruits: Fruit canned in syrup. Meats and other protein foods: Beef. Pork. Lamb. Poultry with skin. Hot dogs. Helene Loader. Dairy: Ice cream. Sour cream. Whole milk. Beverages: Juice. Sugar-sweetened soft drinks. Beer. Liquor  and spirits. Fats and oils: Butter. Canola oil. Vegetable oil. Beef fat (tallow). Lard. Sweets and desserts: Cookies. Cakes. Pies. Candy. Seasoning and other foods: Mayonnaise. Premade sauces and marinades. The items listed may not be a complete list. Talk with your dietitian about what  dietary choices are right for you. Summary The Mediterranean diet includes both food and lifestyle choices. Eat a variety of fresh fruits and vegetables, beans, nuts, seeds, and whole grains. Limit the amount of red meat and sweets that you eat. Talk with your health care provider about whether it is safe for you to drink red wine in moderation. This means 1 glass a day for nonpregnant women and 2 glasses a day for men. A glass of wine equals 5 oz (150 mL). This information is not intended to replace advice given to you by your health care provider. Make sure you discuss any questions you have with your health care provider. Document Released: 07/21/2016 Document Revised: 08/23/2016 Document Reviewed: 07/21/2016 Elsevier Interactive Patient Education  2017 [employer].

## 2024-04-02 NOTE — Progress Notes (Signed)
 Assessment/Plan:     Jimmy Orr is a very pleasant 89 y.o. year old RH male with a history of hypertension, hyperlipidemia, DM2 diet-controlled, GERD, CKD, very HOH status post cochlear implant 2023,  seen today for evaluation of memory loss.  Unable to perform MoCA.  MMSE today is 15/30.  Given his strong family history for Alzheimer's disease, his dementia is likely due to the same.  Patient is already on donepezil 5 mg daily by PCP.  He is able to participate in his ADLs although closer monitoring than prior. Patient no longer drives.  Mood is controlled by his PCP  Dementia likely due to Alzheimer's disease with behavioral disturbance  CT head  without contrast to assess for underlying structural abnormality and assess vascular load (history of cochlear implant not on compatible with MRI) Check B12, TSH Continue donepezil, recommend increasing to 10 mg a daily for better coverage.  Continue to check hearing in an effort to improve comprehension Recommend good control of cardiovascular risk factors.   Continue to control mood as per PCP, if worsening hallucinations, sundowning, consider Seroquel or Depakote  Folllow up in 6 months  Subjective:    The patient is accompanied by his caregiver who supplements the history.    How long did patient have memory difficulties?  Patient denies any memory issues. He attributes some difficulty, related to "bad hearing "of note, he already has a cochlear implant, but does not want to use hearing aids.  His caregiver reports that his memory issues have worsened over the last year.   repeats oneself?  Endorsed by caregiver Disoriented when walking into a room?  Endorsed, at times Leaving objects in unusual places? Hides hearing aids, money, other items and then accuses that someone is steeling them   Wandering behavior?  Typically does not walk alone, walks with the caregiver but there were episodes in which he was alone Any personality changes,  or depression, anxiety?  Until recently, he was more agitated, but since taking new therapy, seems to have helped his mood. Hallucinations or paranoia?  Endorsed .  Recently, he was agitated and angry there were 3 people next to his bed, walking around the house.  These episodes lasted for about 3 days, without recurrence.    Seizures? Denies.    Any sleep changes?  "Sometimes he sleeps great sometimes he does not ".  He is on Remeron with good results.  Denies vivid dreams, REM behavior or sleepwalking.   Sleep apnea? Denies.   Any hygiene concerns?  Yes, "he needs reminder. If he knows he has an appoint he does take a shower on his own ".  He is caregiver leaves a reminder sign so that he gets..   Independent of bathing and dressing?  Needs some assistance getting dressed. Who is in charge of the medications? Nurse is in charge Who is in charge of the finances?  His grandson is in charge Any changes in appetite?   He eats well after starting Remeron.     Patient have trouble swallowing?  Denies.   Does the patient cook?  No, after leaving the stove on  Any headaches?  Denies.   Chronic back pain?  Denies.   Ambulates with difficulty?  He has some instability, but he is not using a walker. Recent falls or head injuries? Denies.     Vision changes? Denies. Stroke like symptoms?  Denies.   Any tremors?  Denies.   Any anosmia?  Denies.  Any incontinence of urine? Recently,within the last few months he urinates in his pants.  He has nocturia, and recently was started on alfuzosin Any bowel dysfunction? Denies.      Patient lives wife and caregiver History of heavy alcohol intake? Denies.   History of heavy tobacco use? Denies.   Family history of dementia?  Mother (siblings had Alzheimer's disease Does patient drive? No longer drives, license is valid but car is not in the house anymore.  Driver's license will not be continued. 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 Was in [employer] as well   Allergies  Allergen Reactions   Morphine And Codeine Nausea And Vomiting   Niacin     REACTION: rash   Statins     REACTION: weakness/muscle aches    Current Outpatient Medications  Medication Instructions   lisinopril (ZESTRIL) 10 mg, Daily   metoprolol succinate (TOPROL-XL) 25 mg, Daily at bedtime   triamcinolone cream (KENALOG) 0.1 % SMARTSIG:Sparingly Topical Twice Daily PRN   Vitamins-Lipotropics (LIPO FLAVONOID PLUS PO) 2 tablets, 3 times daily     VITALS:   Vitals:   04/02/24 1019  Pulse: 62  Resp: 20  SpO2: 98%  Weight: 160 lb (72.6 kg)  Height: 5\' 10"  (1.778 m)      PHYSICAL EXAM   HEENT:  Normocephalic, atraumatic. The mucous membranes are moist. The superficial temporal arteries are without ropiness or tenderness. Cardiovascular: Regular rate and rhythm. Lungs: Clear to auscultation bilaterally. Neck: There are no carotid bruits noted bilaterally.  NEUROLOGICAL:     No data to display             04/02/2024   12:00 PM  MMSE - Mini Mental State Exam  Orientation to time 3  Orientation to Place 0  Registration 2  Attention/ Calculation 3  Recall 0  Language- name 2 objects 2  Language- repeat 1  Language- follow 3 step command 2  Language- read & follow direction 1  Write a sentence 1  Copy design 0  Total score 15     Orientation:  Alert and oriented to person, not to place and time. No aphasia or dysarthria. Fund of knowledge is reduced. Recent and remote memory impaired.  Attention and concentration are reduced.  Able to name objects and unable to repeat phrases. Delayed recall 2/3 Cranial nerves: There is good facial symmetry. Extraocular muscles are intact and visual fields are full to confrontational testing. Speech is fluent and clear, no tongue deviation. Hearing is very decreased to conversational tone.  Tone: Tone is good throughout. Sensation: Sensation is intact to light touch and pinprick  throughout. Vibration is intact at the bilateral big toe. Coordination: The patient has no difficulty with RAM's or FNF bilaterally. Normal finger to nose  Motor: Strength is 5/5 in the bilateral upper and lower extremities. There is no pronator drift. There are no fasciculations noted. DTR's: Deep tendon reflexes are 2/4 .  Plantar responses are downgoing bilaterally. Gait and Station: The patient is able to ambulate with difficulty.. Gait is cautious and narrow.      Thank you for allowing us  the opportunity to participate in the care of this nice patient. Please do not hesitate to contact us  for any questions or concerns.   Total time spent on today's visit was 52 minutes dedicated to this patient today, preparing to see patient, examining the patient, ordering tests and/or medications and counseling the patient, documenting clinical information in  the EHR or other health record, independently interpreting results and communicating results to the patient/family, discussing treatment and goals, answering patient's questions and coordinating care.  Cc:  Tura Gaines, MD  Tex Filbert 04/02/2024 12:02 PM

## 2024-04-03 LAB — VITAMIN B12: Vitamin B-12: 455 pg/mL (ref 200–1100)

## 2024-04-03 LAB — TSH: TSH: 1.37 m[IU]/L (ref 0.40–4.50)

## 2024-04-03 NOTE — Progress Notes (Signed)
B12 and thyroid are normal.

## 2024-04-04 ENCOUNTER — Ambulatory Visit
Admission: RE | Admit: 2024-04-04 | Discharge: 2024-04-04 | Disposition: A | Discharge status: Home / Self Care | Source: Ambulatory Visit | Attending: Physician Assistant | Admitting: Physician Assistant

## 2024-04-17 ENCOUNTER — Telehealth: Payer: Self-pay | Admitting: Physician Assistant

## 2024-04-17 NOTE — Telephone Encounter (Signed)
 error

## 2024-04-28 ENCOUNTER — Ambulatory Visit: Payer: Self-pay | Admitting: Physician Assistant

## 2024-04-29 ENCOUNTER — Ambulatory Visit

## 2024-04-29 ENCOUNTER — Ambulatory Visit: Admitting: Physician Assistant

## 2024-09-19 ENCOUNTER — Telehealth: Payer: Self-pay | Admitting: Physician Assistant

## 2024-09-19 DIAGNOSIS — Z0279 Encounter for issue of other medical certificate: Secondary | ICD-10-CM

## 2024-09-19 NOTE — Telephone Encounter (Signed)
 Place the FL-2 form in box to be completed. Thanks

## 2024-09-24 ENCOUNTER — Telehealth: Payer: Self-pay

## 2024-09-24 ENCOUNTER — Encounter: Payer: Self-pay | Admitting: Physician Assistant

## 2024-09-24 NOTE — Telephone Encounter (Signed)
 Adriane to call back [phone]. She needs a letter dicatating patient has  dementia, so he can get placed in a memory care. Please do thanks

## 2024-10-01 NOTE — Progress Notes (Signed)
 Assessment/Plan:   Dementia likely due to Alzheimer disease***  Jimmy Orr is a very pleasant 89 y.o. RH male with a history ofhypertension, hyperlipidemia, DM2 diet-controlled, GERD, CKD, very HOH status post cochlear implant 2023, and dementia likely due to Alzheimer disease seen today in follow up for memory loss.  He is no longer on antidementia medications.  He is now in memory care.  He needs assistance with some ADLs.  Mood is controlled by PCP.     Follow up in   months.   Recommend good control of her cardiovascular risk factors Continue to control mood as per PCP Continue to follow with audiologist (last visit September 2025, he has a history of cochlear device on the right and a hearing aid on the left Agree with memory care for cognitive, social stimulation and safety      Subjective:    This patient is accompanied in the office by his caregiver*** who supplements the history.  Previous records as well as any outside records available were reviewed prior to todays visit. Patient was last seen on 04/02/2024 with MMSE 15/30***   Any changes in memory since last visit?  Memory is worse over the last 6 months repeats oneself?  Endorsed Disoriented when walking into a room?  Endorsed, at times***  Leaving objects?  May misplace things such as the hearing aids, money and other items and then accuses someone of stealing them.***  Wandering behavior?  denies   Any personality changes since last visit?  Denies.   Any worsening depression?:  Denies.   Hallucinations or paranoia?  Denies.   Seizures? denies    Any sleep changes?  Denies vivid dreams, REM behavior or sleepwalking   Sleep apnea?   Denies.   Any hygiene concerns? Denies.  Independent of bathing and dressing?  Endorsed  Does the patient needs help with medications?  Nurse is in charge *** Who is in charge of the finances?  Jimmy Orr is in charge   *** Any changes in appetite?  Decreased.***   Patient have  trouble swallowing? Denies.   Any headaches?   denies   Any vision changes?*** Chronic back pain  denies   Ambulates with difficulty? Denies.  *** Recent falls or head injuries? Denies.     Unilateral weakness, numbness or tingling? denies   Any tremors?  Denies  *** Any anosmia?  Denies   Any incontinence of urine?  Endorsed, wears diapers.  He also has nocturia***  Any bowel dysfunction?   Denies      Patient lives   *** Does the patient drive? No longer drives ***  Initial visit April 2025 How long did patient have memory difficulties?  Patient denies any memory issues. He attributes some difficulty, related to bad hearing of note, he already has a cochlear implant, but does not want to use hearing aids.  His caregiver reports that his memory issues have worsened over the last year.   repeats oneself?  Endorsed by caregiver Disoriented when walking into a room?  Endorsed, at times Leaving objects in unusual places? Hides hearing aids, money, other items and then accuses that someone is steeling them   Wandering behavior?  Typically does not walk alone, walks with the caregiver but there were episodes in which he was alone Any personality changes, or depression, anxiety?  Until recently, he was more agitated, but since taking new therapy, seems to have helped his mood. Hallucinations or paranoia?  Endorsed .  Recently, he  was agitated and angry there were 3 people next to his bed, walking around the house.  These episodes lasted for about 3 days, without recurrence.    Seizures? Denies.    Any sleep changes?  Sometimes he sleeps great sometimes he does not .  He is on Remeron with good results.  Denies vivid dreams, REM behavior or sleepwalking.   Sleep apnea? Denies.   Any hygiene concerns?  Yes, he needs reminder. If he knows he has an appoint he does take a shower on his own .  He is caregiver leaves a reminder sign so that he gets..   Independent of bathing and dressing?  Needs  some assistance getting dressed. Who is in charge of the medications? Nurse is in charge Who is in charge of the finances?  His grandson is in charge Any changes in appetite?   He eats well after starting Remeron.     Patient have trouble swallowing?  Denies.   Does the patient cook?  No, after leaving the stove on  Any headaches?  Denies.   Chronic back pain?  Denies.   Ambulates with difficulty?  He has some instability, but he is not using a walker. Recent falls or head injuries? Denies.     Vision changes? Denies. Stroke like symptoms?  Denies.   Any tremors?  Denies.   Any anosmia?  Denies.   Any incontinence of urine? Recently,within the last few months he urinates in his pants.  He has nocturia, and recently was started on alfuzosin Any bowel dysfunction? Denies.      Patient lives wife and caregiver History of heavy alcohol intake? Denies.   History of heavy tobacco use? Denies.   Family history of dementia?  Mother (siblings had Alzheimer's disease Does patient drive? No longer drives, license is valid but car is not in the house anymore.  Driver's license will not be continued. 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  PREVIOUS MEDICATIONS:   CURRENT MEDICATIONS:  Outpatient Encounter Medications as of 10/02/2024  Medication Sig   lisinopril (PRINIVIL,ZESTRIL) 10 MG tablet Take 10 mg by mouth daily.   metoprolol succinate (TOPROL-XL) 25 MG 24 hr tablet Take 25 mg by mouth at bedtime.   triamcinolone cream (KENALOG) 0.1 % SMARTSIG:Sparingly Topical Twice Daily PRN   Vitamins-Lipotropics (LIPO FLAVONOID PLUS PO) Take 2 tablets by mouth 3 (three) times daily.   No facility-administered encounter medications on file  as of 10/02/2024.       04/02/2024   12:00 PM  MMSE - Mini Mental State Exam  Orientation to time 3  Orientation to Place 0  Registration 2  Attention/ Calculation 3  Recall 0  Language- name 2 objects 2  Language- repeat 1  Language- follow 3 step command 2  Language- read & follow direction 1  Write a sentence 1  Copy design 0  Total score 15       No data to display          Objective:     PHYSICAL EXAMINATION:    VITALS:  There were no vitals filed for this visit.  GEN:  The patient appears stated age and is in NAD. HEENT:  Normocephalic, atraumatic.   Neurological examination:  General:  NAD, well-groomed, appears stated age. Orientation: The patient is alert. Oriented to person, not to place and date Cranial nerves: There is good facial symmetry.The speech is fluent and clear. No aphasia or dysarthria. Fund of knowledge is reduced. Recent and remote memory are impaired. Attention and concentration are reduced. Able to name objects and able to repeat phrases.  Hearing is very decreased to conversational tone. *** Sensation: Sensation is intact to light touch throughout Motor: Strength is at least antigravity x4. DTR's 2/4 in UE/LE     Movement examination: Tone: There is normal tone in the UE/LE Abnormal movements:  no tremor.  No myoclonus.  No asterixis.   Coordination:  There is no decremation with RAM's. Normal finger to nose  Gait and Station: The patient has some difficulty arising out of a deep-seated chair without the use of the hands. The patient's stride length is unstable as before.  Gait is cautious and narrow.    Thank you for allowing us  the opportunity to participate in the care of this nice patient. Please do not hesitate to contact us  for any questions or concerns.   Total time spent on today's visit was *** minutes dedicated to this patient today, preparing to see patient, examining the patient, ordering tests and/or medications and  counseling the patient, documenting clinical information in the EHR or other health record, independently interpreting results and communicating results to the patient/family, discussing treatment and goals, answering patient's questions and coordinating care.  Cc:  Tanda Prentice DEL, MD  Camie Sevin 10/01/2024 7:09 AM

## 2024-10-02 ENCOUNTER — Encounter: Payer: Self-pay | Admitting: Physician Assistant

## 2024-10-02 ENCOUNTER — Ambulatory Visit: Admitting: Physician Assistant

## 2024-10-02 VITALS — BP 142/64 | HR 88 | Resp 20 | Ht 70.0 in

## 2024-10-02 DIAGNOSIS — F1027 Alcohol dependence with alcohol-induced persisting dementia: Secondary | ICD-10-CM | POA: Diagnosis not present

## 2024-10-02 NOTE — Patient Instructions (Signed)
 It was a pleasure to see you today at our office.   Recommendations:   Continue to check hearing  Agree with memory care  Recommend good control of cardiovascular risk factors.    Continue to control mood as per PCP NO follow up is indicated Recommend visiting the website :  Dementia Success Path to better understand some behaviors related to memory loss.        [email or web address]://[email or web address]/neuro-[email or web address]-apps-[email or web address]-[email or web address]   RECOMMENDATIONS FOR ALL PATIENTS WITH MEMORY PROBLEMS: 1. Continue to exercise (Recommend 30 minutes of walking everyday, or 3 hours every week) 2. Increase social interactions - continue going to [place] and enjoy social gatherings with friends and family 3. Eat healthy, avoid fried foods and eat more fruits and vegetables 4. Maintain adequate blood pressure, blood sugar, and blood cholesterol level. Reducing the risk of stroke and cardiovascular disease also helps promoting better memory. 5. Avoid stressful situations. Live a simple life and avoid aggravations. Organize your time and prepare for the next day in anticipation. 6. Sleep well, avoid any interruptions of sleep and avoid any distractions in the bedroom that may interfere with adequate sleep quality 7. Avoid sugar, avoid sweets as there is a strong link between excessive sugar intake, diabetes, and cognitive impairment We discussed the Mediterranean diet, which has been shown to help patients reduce the risk of progressive memory disorders and reduces cardiovascular risk. This includes eating fish, eat fruits and green leafy vegetables, nuts like almonds and hazelnuts, walnuts, and also use olive oil. Avoid fast foods and fried foods as much as possible. Avoid sweets and sugar as sugar use has been linked to worsening of memory function.  There is always a concern of gradual progression of memory problems. If this is the case, then we may need to adjust level of care according to patient needs.  Support, both to the patient and caregiver, should then be put into place.    The Alzheimer's Association is here all day, every day for people facing Alzheimer's disease through our free 24/7 Helpline: 401-335-9352. The Helpline provides reliable information and support to all those who need assistance, such as individuals living with memory loss, Alzheimer's or other dementia, caregivers, health care professionals and the public.  Our highly trained and knowledgeable staff can help you with: Understanding memory loss, dementia and Alzheimer's  Medications and other treatment options  General information about aging and brain health  Skills to provide quality care and to find the best care from professionals  Legal, financial and living-arrangement decisions Our Helpline also features: Confidential care consultation provided by master's level clinicians who can help with decision-making support, crisis assistance and education on issues families face every day  Help in a caller's preferred language using our translation service that features more than 200 languages and dialects  Referrals to local community programs, services and ongoing support     FALL PRECAUTIONS: Be cautious when walking. Scan the area for obstacles that may increase the risk of trips and falls. When getting up in the mornings, sit up at the edge of the bed for a few minutes before getting out of bed. Consider elevating the bed at the head end to avoid drop of blood pressure when getting up. Walk always in a well-lit room (use night lights in the walls). Avoid area rugs or power cords from appliances in the middle of the walkways. Use a walker or a cane if necessary and consider physical therapy for balance exercise. Get your eyesight checked regularly.  FINANCIAL OVERSIGHT: Supervision, especially oversight when making financial decisions or transactions is also recommended.  HOME SAFETY: Consider the safety of the  kitchen when operating appliances like stoves, microwave oven, and blender. Consider having supervision and share cooking responsibilities until no longer able to participate in those. Accidents with firearms and other hazards in the house should be identified and addressed as well.   ABILITY TO BE LEFT ALONE: If patient is unable to contact 911 operator, consider using LifeLine, or when the need is there, arrange for someone to stay with patients. Smoking is a fire hazard, consider supervision or cessation. Risk of wandering should be assessed by caregiver and if detected at any point, supervision and safe proof recommendations should be instituted.  MEDICATION SUPERVISION: Inability to self-administer medication needs to be constantly addressed. Implement a mechanism to ensure safe administration of the medications.   DRIVING: Regarding driving, in patients with progressive memory problems, driving will be impaired. We advise to have someone else do the driving if trouble finding directions or if minor accidents are reported. Independent driving assessment is available to determine safety of driving.   If you are interested in the driving assessment, you can contact the following:  [employer] in [place] [phone]   [facility] [phone] or [phone]      Mediterranean Diet A Mediterranean diet refers to food and lifestyle choices that are based on the traditions of countries located on the [employer] [place]. This way of eating has been shown to help prevent certain conditions and improve outcomes for people who have chronic diseases, like kidney disease and heart disease. What are tips for following this plan? Lifestyle  Cook and eat meals together with your family, when possible. Drink enough fluid to keep your urine clear or pale yellow. Be physically active every day. This includes: Aerobic exercise like running or  swimming. Leisure activities like gardening, walking, or housework. Get 7-8 hours of sleep each night. If recommended by your health care provider, drink red wine in moderation. This means 1 glass a day for nonpregnant women and 2 glasses a day for men. A glass of wine equals 5 oz (150 mL). Reading food labels  Check the serving size of packaged foods. For foods such as rice and pasta, the serving size refers to the amount of cooked product, not dry. Check the total fat in packaged foods. Avoid foods that have saturated fat or trans fats. Check the ingredients list for added sugars, such as corn syrup. Shopping  At the grocery store, buy most of your food from the areas near the walls of the store. This includes: Fresh fruits and vegetables (produce). Grains, beans, nuts, and seeds. Some of these may be available in unpackaged forms or large amounts (in bulk). Fresh seafood. Poultry and eggs. Low-fat dairy products. Buy whole ingredients instead of prepackaged foods. Buy fresh fruits and vegetables in-season from local farmers markets. Buy frozen fruits and vegetables in resealable bags. If you do not have access to quality fresh seafood, buy precooked frozen shrimp or canned fish, such as tuna, salmon, or sardines. Buy small amounts of raw or cooked vegetables, salads, or olives from the deli or salad bar at your store. Stock your pantry so you always have certain foods on hand, such as olive oil, canned tuna, canned tomatoes, rice, pasta, and beans. Cooking  Cook foods with extra-virgin olive oil instead of using butter or other vegetable oils. Have meat as a  side dish, and have vegetables or grains as your main dish. This means having meat in small portions or adding small amounts of meat to foods like pasta or stew. Use beans or vegetables instead of meat in common dishes like chili or lasagna. Experiment with different cooking methods. Try roasting or broiling vegetables instead of  steaming or sauteing them. Add frozen vegetables to soups, stews, pasta, or rice. Add nuts or seeds for added healthy fat at each meal. You can add these to yogurt, salads, or vegetable dishes. Marinate fish or vegetables using olive oil, lemon juice, garlic, and fresh herbs. Meal planning  Plan to eat 1 vegetarian meal one day each week. Try to work up to 2 vegetarian meals, if possible. Eat seafood 2 or more times a week. Have healthy snacks readily available, such as: Vegetable sticks with hummus. Greek yogurt. Fruit and nut trail mix. Eat balanced meals throughout the week. This includes: Fruit: 2-3 servings a day Vegetables: 4-5 servings a day Low-fat dairy: 2 servings a day Fish, poultry, or lean meat: 1 serving a day Beans and legumes: 2 or more servings a week Nuts and seeds: 1-2 servings a day Whole grains: 6-8 servings a day Extra-virgin olive oil: 3-4 servings a day Limit red meat and sweets to only a few servings a month What are my food choices? Mediterranean diet Recommended Grains: Whole-grain pasta. Brown rice. Bulgar wheat. Polenta. Couscous. Whole-wheat bread. [place] Made[place]. Vegetables: Artichokes. Beets. Broccoli. Cabbage. Carrots. Eggplant. Green beans. Chard. Kale. Spinach. Onions. Leeks. Peas. Squash. Tomatoes. Peppers. Radishes. Fruits: Apples. Apricots. Avocado. Berries. Bananas. Cherries. Dates. Figs. Grapes. Lemons. Melon. Oranges. Peaches. Plums. Pomegranate. Meats and other protein foods: Beans. Almonds. Sunflower seeds. Pine nuts. Peanuts. Cod. Salmon. Scallops. Shrimp. Tuna. Tilapia. Clams. Oysters. Eggs. Dairy: Low-fat milk. Cheese. Greek yogurt. Beverages: Water. Red wine. Herbal tea. Fats and oils: Extra virgin olive oil. Avocado oil. Grape seed oil. Sweets and desserts: [place] yogurt with honey. Baked apples. Poached pears. Trail mix. Seasoning and other foods: Basil. Cilantro. Coriander. Cumin. Mint. Parsley. Sage. Rosemary. Tarragon. Garlic.  Oregano. Thyme. Pepper. Balsalmic vinegar. Tahini. Hummus. Tomato sauce. Olives. Mushrooms. Limit these Grains: Prepackaged pasta or rice dishes. Prepackaged cereal with added sugar. Vegetables: Deep fried potatoes (french fries). Fruits: Fruit canned in syrup. Meats and other protein foods: Beef. Pork. Lamb. Poultry with skin. Hot dogs. [place]. Dairy: Ice cream. Sour cream. Whole milk. Beverages: Juice. Sugar-sweetened soft drinks. Beer. Liquor and spirits. Fats and oils: Butter. Canola oil. Vegetable oil. Beef fat (tallow). Lard. Sweets and desserts: Cookies. Cakes. Pies. Candy. Seasoning and other foods: Mayonnaise. Premade sauces and marinades. The items listed may not be a complete list. Talk with your dietitian about what dietary choices are right for you. Summary The Mediterranean diet includes both food and lifestyle choices. Eat a variety of fresh fruits and vegetables, beans, nuts, seeds, and whole grains. Limit the amount of red meat and sweets that you eat. Talk with your health care provider about whether it is safe for you to drink red wine in moderation. This means 1 glass a day for nonpregnant women and 2 glasses a day for men. A glass of wine equals 5 oz (150 mL). This information is not intended to replace advice given to you by your health care provider. Make sure you discuss any questions you have with your health care provider. Document Released: 07/21/2016 Document Revised: 08/23/2016 Document Reviewed: 07/21/2016 Elsevier Interactive Patient Education  2017 [employer].
